# Patient Record
Sex: Male | Born: 1935 | Race: White | Hispanic: No | Marital: Married | Smoking: Never smoker
Health system: Southern US, Community
[De-identification: ages and names within clinical notes are randomized; demographics above are authoritative.]

## PROBLEM LIST (undated history)

## (undated) DIAGNOSIS — J45909 Unspecified asthma, uncomplicated: Secondary | ICD-10-CM

## (undated) HISTORY — PX: APPENDECTOMY: SHX54

---

## 2015-12-07 ENCOUNTER — Emergency Department
Admission: EM | Admit: 2015-12-07 | Discharge: 2015-12-07 | Disposition: A | Discharge status: Home / Self Care | Payer: Medicare Other | Attending: Student in an Organized Health Care Education/Training Program | Admitting: Student in an Organized Health Care Education/Training Program

## 2015-12-07 ENCOUNTER — Encounter: Payer: Self-pay | Admitting: Emergency Medicine

## 2015-12-07 DIAGNOSIS — H9222 Otorrhagia, left ear: Secondary | ICD-10-CM | POA: Insufficient documentation

## 2015-12-07 NOTE — Discharge Instructions (Signed)
Apply constant pressure to your ear if any continued bleeding. Follow-up with your doctor if any continued problems. Avoid ibuprofen or anti-inflammatories which may thin your blood and increased your risk. Return to the emergency room if any urgent concerns.

## 2015-12-07 NOTE — ED Triage Notes (Signed)
Pt 's earlobe started bleeding about 1 hour ago. States it happened 20 years ago and hasn't happened since until today.

## 2015-12-07 NOTE — ED Provider Notes (Signed)
[facility] Emergency Department Provider Note  ____________________________________________   First MD Initiated Contact with Patient 12/07/15 2207     (approximate)  I have reviewed the triage vital signs and the nursing notes.   HISTORY  Chief Complaint Ear Drainage   HPI Patrick Wu is a 82 y.o. male is here with complaint of left ear bleeding steadily for over an hour. Patient states that he held pressure to it for approximately 30 minutes without resolution of his bleeding. Patient denies any anti-inflammatories or blood thinners. He states he had something similar to this happen to 20 years ago when he was not on any medication. He denies any dizziness, any pain, or problems with his ear or hearing. Patient now states that he is ready to go because the bleeding has completely stopped.She denies any pain.   History reviewed. No pertinent past medical history.  There are no active problems to display for this patient.   Past Surgical History:  Procedure Laterality Date  . APPENDECTOMY      Prior to Admission medications   Not on File    Allergies Patient has no known allergies.  No family history on file.  Social History Social History  Substance Use Topics  . Smoking status: Never Smoker  . Smokeless tobacco: Not on file  . Alcohol use Yes     Comment: occassionally     Review of Systems Constitutional: No fever/chills ENT: Denies ear pain. Cardiovascular: Denies chest pain. Respiratory: Denies shortness of breath. Gastrointestinal:  No nausea, no vomiting.  Musculoskeletal: Negative for back pain. Skin: Positive for skin bleed left ear. Neurological: Negative for headaches, focal weakness or numbness.  10-point ROS otherwise negative.  ____________________________________________   PHYSICAL EXAM:  VITAL SIGNS: ED Triage Vitals  Enc Vitals Group     BP      Pulse      Resp      Temp      Temp src      SpO2       Weight      Height      Head Circumference      Peak Flow      Pain Score      Pain Loc      Pain Edu?      Excl. in [place]?     Constitutional: Alert and oriented. Well appearing and in no acute distress. Eyes: Conjunctivae are normal. PERRL. EOMI. Head: Atraumatic. Nose: No congestion/rhinnorhea.  Left earlobe there is no active bleeding at this time. There is a pinpoint suspicious area on the posterior reticular however there is no bleeding and there is no evidence of bleeding. Ear canal no blood was seen. Neck: No stridor.   Cardiovascular: Normal rate, regular rhythm. Grossly normal heart sounds.  Good peripheral circulation. Respiratory: Normal respiratory effort.  No retractions. Lungs CTAB. Musculoskeletal: Moves upper and lower extremities without any difficulty. Normal gait was noted. Neurologic:  Normal speech and language. No gross focal neurologic deficits are appreciated. No gait instability. Skin:  Skin is warm, dry and intact. No evidence of abrasion or active bleeding. No ecchymosis, erythema noted to the left ear. Psychiatric: Mood and affect are normal. Speech and behavior are normal.  ____________________________________________   LABS (all labs ordered are listed, but only abnormal results are displayed)  Labs Reviewed - No data to display  PROCEDURES  Procedure(s) performed: None  Procedures  Critical Care performed: No  ____________________________________________   INITIAL IMPRESSION /  ASSESSMENT AND PLAN / ED COURSE  Pertinent labs & imaging results that were available during my care of the patient were reviewed by me and considered in my medical decision making (see chart for details).    Clinical Course    Patient will follow up with his primary care doctor if any continued problems. He'll also hold pressure continuously if another bleed and return to the emergency room for any urgent  concerns.  ____________________________________________   FINAL CLINICAL IMPRESSION(S) / ED DIAGNOSES  Final diagnoses:  Bleeding from left ear      NEW MEDICATIONS STARTED DURING THIS VISIT:  There are no discharge medications for this patient.    Note:  This document was prepared using Dragon voice recognition software and may include unintentional dictation errors.    Johnn Hai, PA-C 12/07/15 2244    Merlyn Lot, MD 12/07/15 2350

## 2018-08-20 ENCOUNTER — Other Ambulatory Visit: Payer: Self-pay | Admitting: Internal Medicine

## 2018-08-20 ENCOUNTER — Ambulatory Visit
Admission: RE | Admit: 2018-08-20 | Discharge: 2018-08-20 | Disposition: A | Discharge status: Home / Self Care | Payer: Medicare Other | Source: Ambulatory Visit | Attending: Internal Medicine | Admitting: Internal Medicine

## 2018-08-20 ENCOUNTER — Other Ambulatory Visit: Payer: Self-pay

## 2018-08-20 DIAGNOSIS — R0602 Shortness of breath: Secondary | ICD-10-CM

## 2018-08-20 DIAGNOSIS — R0902 Hypoxemia: Secondary | ICD-10-CM | POA: Insufficient documentation

## 2018-08-20 HISTORY — DX: Unspecified asthma, uncomplicated: J45.909

## 2018-08-20 IMAGING — CT CT ANGIOGRAPHY CHEST
2 of 6 series · 18 of 36 positions shown · IV contrast (APPLIED)
Comparison: None.

CLINICAL DATA: Wheezing with nonproductive cough. Shortness of
breath. History of asthma. Clinical concern for pulmonary embolism.

EXAM:
CT ANGIOGRAPHY CHEST WITH CONTRAST
TECHNIQUE: Multidetector CT imaging of the chest was performed using the
standard protocol during bolus administration of intravenous
contrast. Multiplanar CT image reconstructions and MIPs were
obtained to evaluate the vascular anatomy.
CONTRAST:  75mL OMNIPAQUE IOHEXOL 350 MG/ML SOLN

[Series 5: thins · axial · 0.82mm/px · z∈[-400,-108]mm · 17 of 326 slices shown]
[im 17/326  lung]
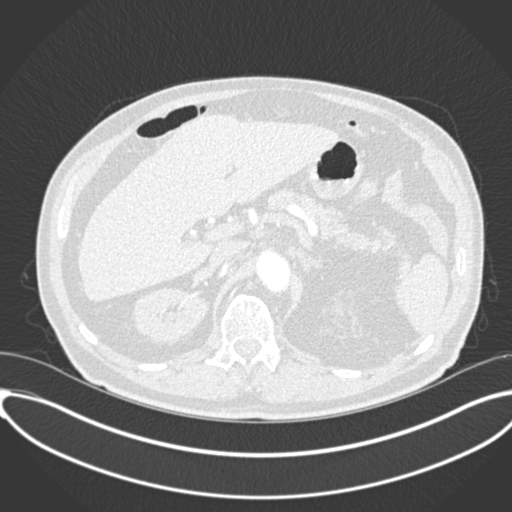
[im 33/326  mediastinal]
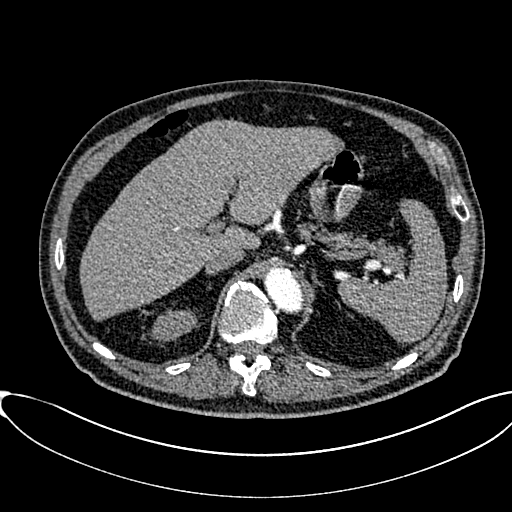
[im 49/326  lung]
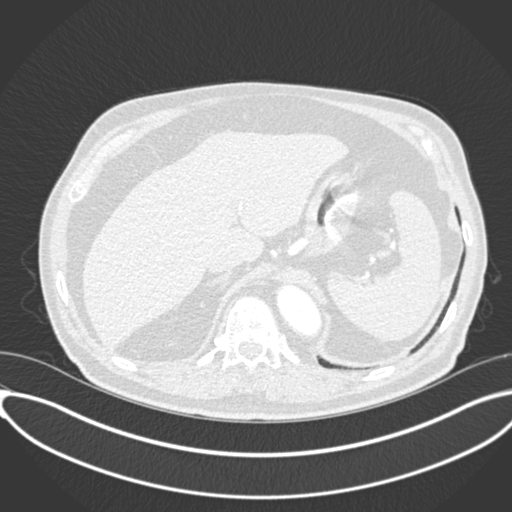
[im 66/326  mediastinal]
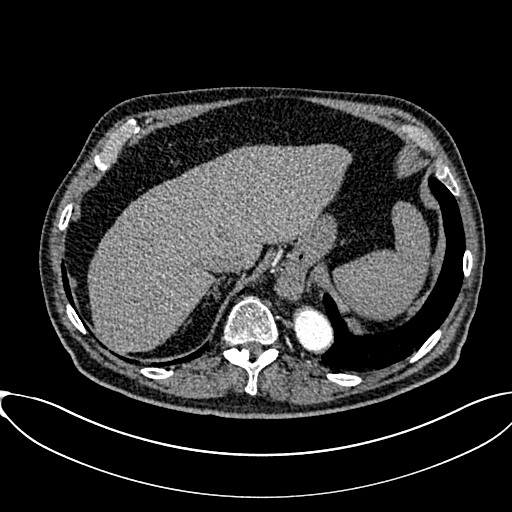
[im 98/326  lung]
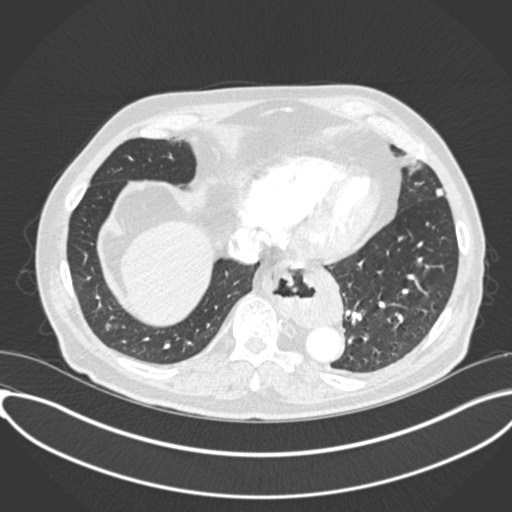
[im 114/326  mediastinal]
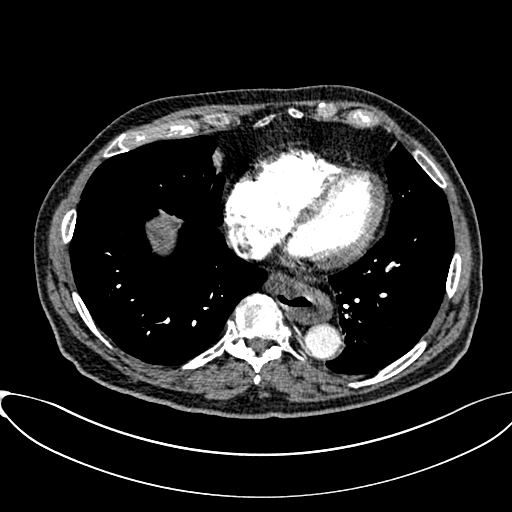
[im 131/326  lung]
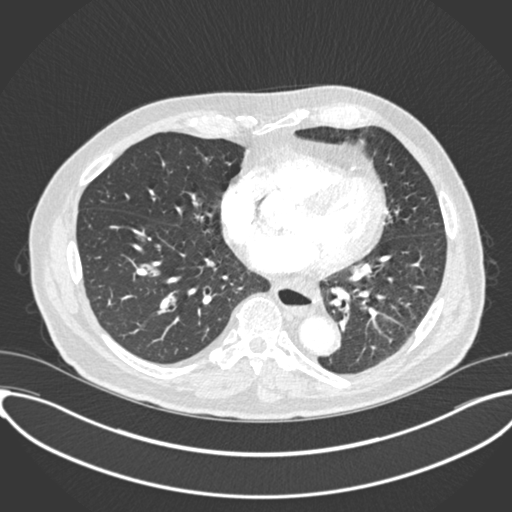
[im 147/326  mediastinal]
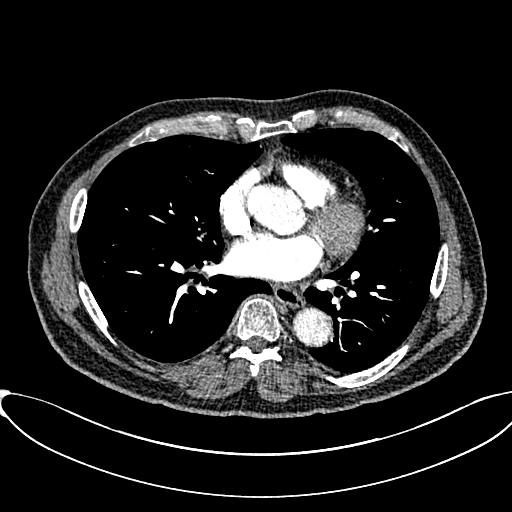
[im 163/326  lung]
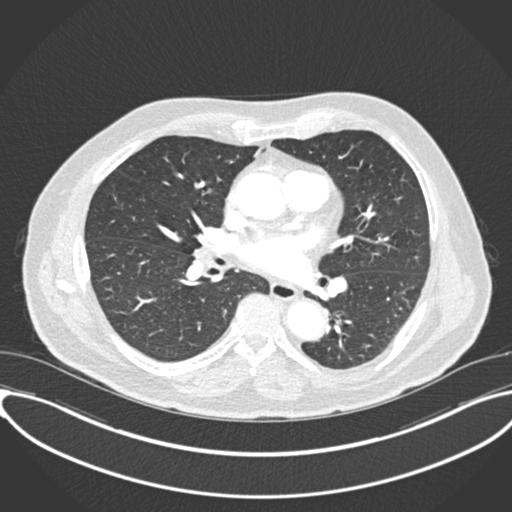
[im 179/326  mediastinal]
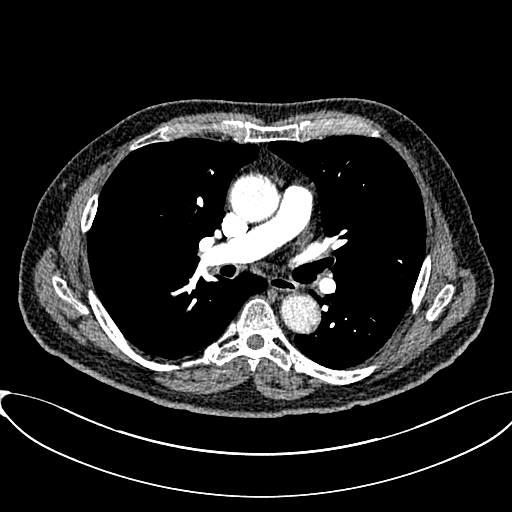
[im 196/326  lung]
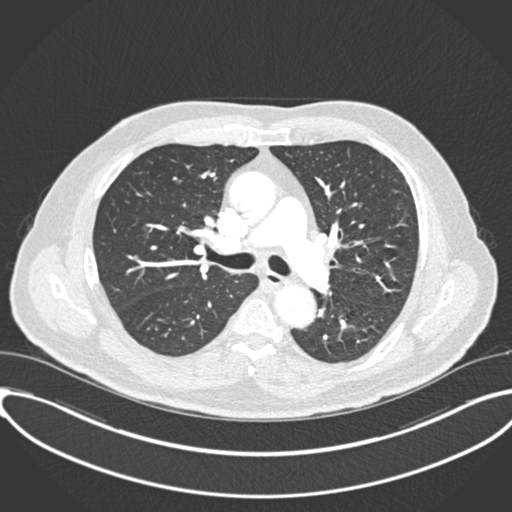
[im 212/326  mediastinal]
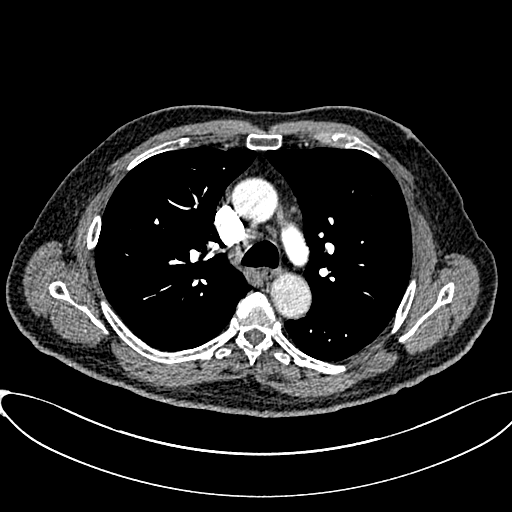
[im 228/326  lung]
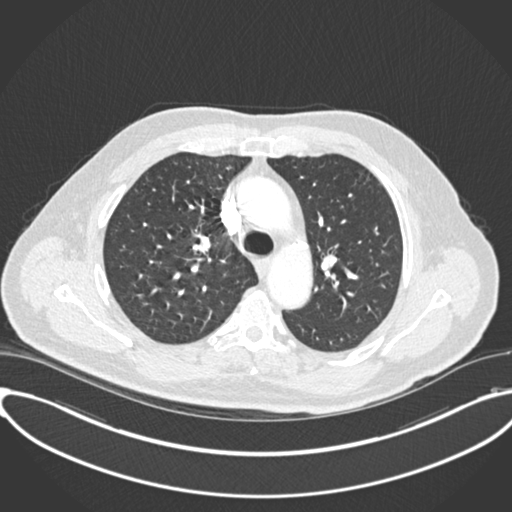
[im 261/326  mediastinal]
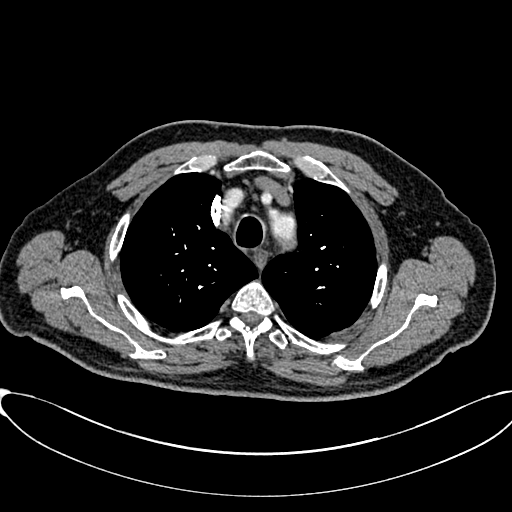
[im 277/326  lung]
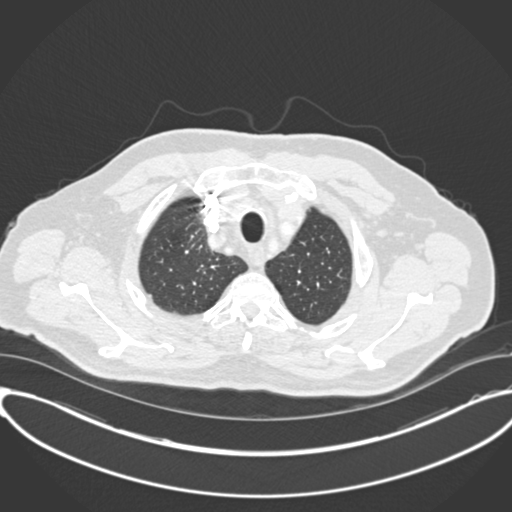
[im 293/326  mediastinal]
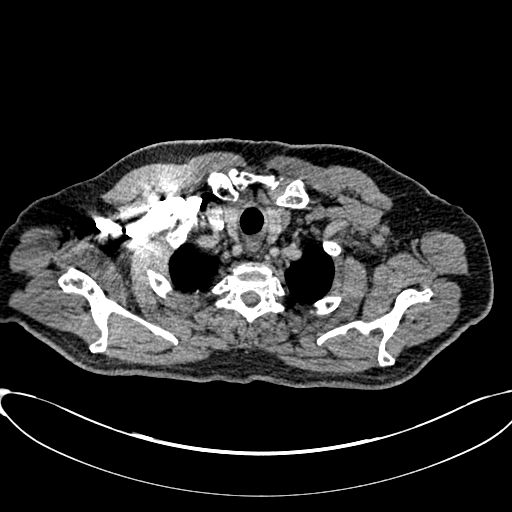
[im 309/326  lung]
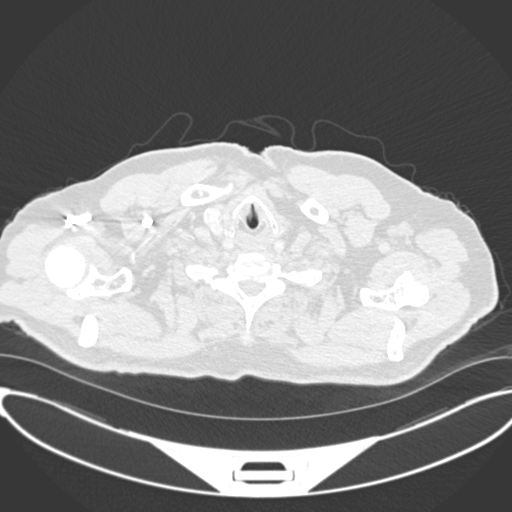

[Series 7: coronal mpr · coronal · 0.64mm/px · 1 of 93 slices shown]
[im 47/93  mediastinal]
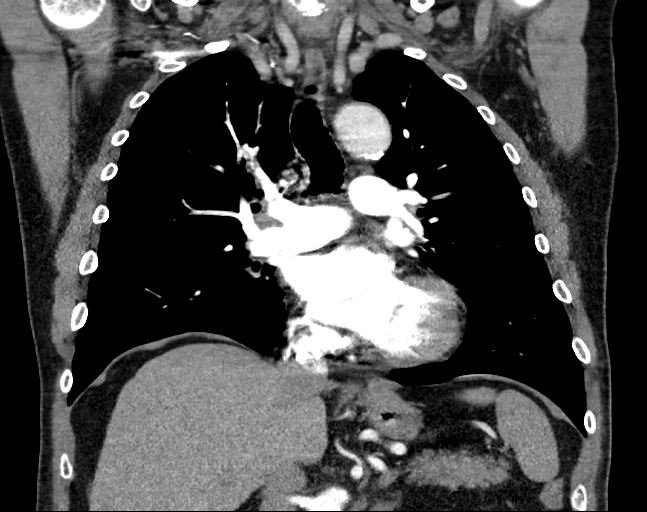

[18 of 36 positions shown; findings below may reference images not displayed]

FINDINGS: Cardiovascular: The pulmonary arteries are well opacified with
contrast to the level of the subsegmental branches. There is no
evidence of acute pulmonary embolism. Minimal aortic
atherosclerosis. The heart size is normal. There is no pericardial
effusion.

Mediastinum/Nodes: There are no enlarged mediastinal, hilar or
axillary lymph nodes.There is a moderate size hiatal hernia. The
trachea and thyroid gland appear normal.

Lungs/Pleura: There is no pleural effusion or pneumothorax. There is
mild centrilobular emphysema and central airway thickening. No
airspace disease or suspicious pulmonary nodularity.

Upper abdomen: No significant findings are seen within the
visualized upper abdomen. There is a small exophytic left renal
lesion which measures near water density, consistent with a cyst.

Musculoskeletal/Chest wall: There is no chest wall mass or
suspicious osseous finding. There is a nonacute appearing superior
endplate compression deformity at T12.

Review of the MIP images confirms the above findings.
IMPRESSION: 1. No evidence of acute pulmonary embolism or other acute chest
process.
2. Mild emphysema and mild central airway thickening.
3. Moderate-size hiatal hernia.
4. Nonacute appearing superior endplate compression deformity at
T12.

## 2018-08-20 MED ORDER — IOHEXOL 350 MG/ML SOLN
75.0000 mL | Freq: Once | INTRAVENOUS | Status: AC | PRN
  Administered 2018-08-20: 10:00:00 75 mL via INTRAVENOUS

## 2019-03-26 DIAGNOSIS — C4491 Basal cell carcinoma of skin, unspecified: Secondary | ICD-10-CM

## 2019-03-26 HISTORY — DX: Basal cell carcinoma of skin, unspecified: C44.91

## 2020-01-23 ENCOUNTER — Ambulatory Visit: Payer: Medicare Other | Attending: Sports Medicine

## 2020-01-23 ENCOUNTER — Other Ambulatory Visit: Payer: Self-pay

## 2020-01-23 DIAGNOSIS — M25511 Pain in right shoulder: Secondary | ICD-10-CM | POA: Diagnosis present

## 2020-01-23 DIAGNOSIS — G8929 Other chronic pain: Secondary | ICD-10-CM | POA: Insufficient documentation

## 2020-01-23 NOTE — Patient Instructions (Addendum)
Access Code: [email or web address] URL: [email or web address] Date: 01/23/2020 Prepared by: Roxana Hires  Exercises Seated Shoulder Abduction AAROM with Pulley Behind - 2 x daily - 7 x weekly - 2 sets - 10 reps - 3s hold Sleeper Stretch - 2 x daily - 7 x weekly - 3 reps - 45s hold Shoulder External Rotation and Scapular Retraction with Resistance - 1 x daily - 7 x weekly - 2 sets - 10 reps - 3s hold

## 2020-01-23 NOTE — Therapy (Unsigned)
[facility] [address] [place], [place], [address] Phone: [phone]   Fax:  [phone]  Physical Therapy Evaluation  Patient Details  Name: Patrick Wu MRN: [id number] Date of Birth: [birth year] Referring Provider (PT): Dr. Emily Filbert   Encounter Date: 01/23/2020   PT End of Session - 01/24/20 0953    Visit Number 1    Number of Visits 9    Date for PT Re-Evaluation 03/19/20    Authorization Type eval: 01/23/20    PT Start Time 1105    PT Stop Time 1150    PT Time Calculation (min) 45 min    Activity Tolerance Patient tolerated treatment well    Behavior During Therapy [facility] for tasks assessed/performed           Past Medical History:  Diagnosis Date  . Asthma     Past Surgical History:  Procedure Laterality Date  . APPENDECTOMY      There were no vitals filed for this visit.    Subjective Assessment - 01/23/20 1102    Subjective R shoulder pain    Pertinent History Pt reports that approximately 9 months ago he started having R shoulder pain. Pain is in the anterior aspect of shoulder and occasionally lateral near distal insertion of deltoid. Onset was insidious. He has a remote history of R shoulder bursitis (87 years old). He got injections and it resolved without any recurrence. No issues with neck injury, pain, or trauma. Shoulder symptoms have been unchanged recently. The pain is most notable when reaching behind his back to fasten his belt, dry his back, or access his back pocket. Pain will occasionally wake him up at night. He denies any imaging of his R shoulder. Denies any additional recent changes in health or medications.    Diagnostic tests None    Patient Stated Goals Decrease R shoulder pain and improve function when reaching behind back    Currently in Pain? No/denies    Pain Score 0-No pain   Worst: 5/10, Best: 0/10   Pain Location Shoulder    Pain Orientation Right    Pain Descriptors /  Indicators Other (Comment)   "Grabbing, catching"   Pain Type Chronic pain    Pain Radiating Towards None    Pain Onset More than a month ago    Pain Frequency Intermittent    Aggravating Factors  reaching behind back to fasten belt, dry back, or access back pocket    Pain Relieving Factors avoiding aggravating positions    Multiple Pain Sites No                [facility] PT Assessment - 01/24/20 0952      Assessment   Medical Diagnosis R shoulder pain    Referring Provider (PT) Dr. Emily Filbert    Onset Date/Surgical Date 04/11/19   Approximate   Hand Dominance Right    Next MD Visit Not reported    Prior Therapy None for shoulder      Precautions   Precautions None      Restrictions   Weight Bearing Restrictions No      Balance Screen   Has the patient fallen in the past 6 months No    Has the patient had a decrease in activity level because of a fear of falling?  No    Is the patient reluctant to leave their home because of a fear of falling?  No      Home  [place] residence    Living Arrangements Spouse/significant other    Available Help at Discharge Family    Type of [place]      Prior Function   Level of Independence Independent      Cognition   Overall Cognitive Status Within Functional Limits for tasks assessed                SUBJECTIVE Onset: Pt reports that approximately 9 months ago he started having R shoulder pain. Pain is in the anterior aspect of shoulder and occasionally lateral near distal insertion of deltoid. Onset was insidious. He has a remote history of R shoulder bursitis (87 years old). He got injections and it resolved without any recurrence. No issues with neck injury, pain, or trauma. Shoulder symptoms have been unchanged recently. The pain is most notable when reaching behind his back to fasten his belt, dry his back, or access his back pocket. Pain will occasionally wake him up at night. He denies any  imaging of his R shoulder. Denies any additional recent changes in health or medications.   Shoulder trauma: No  Pain quality: "grabbing, catching pain," Denies clicking or popping; Pain: Present: 0/10, Best: 0/10, Worst: 5/10; Aggravating factors: reaching behind the back to access back pocket, thread belt, or dry back after shower Easing factors: staying out of aggravating positions 24 hour pain behavior: No change Radiating pain: No Numbness/Tingling: No Prior history of shoulder injury or pain: No, except remote history of bursitis at age 61 Prior history of therapy for shoulder: No Dominant hand: Right   OBJECTIVE  MUSCULOSKELETAL: Tremor: Normal Bulk: Normal Tone: Normal  Cervical Screen AROM: WFL and painless with overpressure in all planes Spurlings A (ipsilateral lateral flexion/axial compression): R: Negative L: Negative Spurlings B (ipsilateral lateral flexion/contralateral rotation/axial compression): R: Negative L: Negative Hoffman Sign (cervical cord compression): Not examined ULTT Median: R: Not examined L: Not examined ULTT Ulnar: R: Not examined L: Not examined ULTT Radial: R: Not examined L: Not examined  Elbow Screen Elbow AROM: Within Normal Limits  Palpation Patient has moderate pain to palpation over long head of bicep tendon anterior bilateral shoulder.  Slightly more tender on the right side.  No pain to palpation along the lateral or posterior right shoulder including in the suprascapular or infrascapular fossa.  No pain to palpation at greater or lesser tubercles or along subscapularis and teres minor.  Strength R/L 5/5 Shoulder flexion (anterior deltoid/pec major/coracobrachialis, axillary n. (C5-6) and musculocutaneous n. (C5-7)) 5/5 Shoulder abduction (deltoid/supraspinatus, axillary/suprascapular n, C5) 4-*/4+ Shoulder external rotation, tested in sitting (infraspinatus/teres minor) 5/5 Shoulder internal rotation (subcapularis/lats/pec  major) 5/5 Elbow flexion (biceps brachii, brachialis, brachioradialis, musculoskeletal n, C5-6) 5/5 Elbow extension (triceps, radial n, C7) 5/5 Wrist Extension 5/5 Wrist Flexion 5/5 Finger adduction (interossei, ulnar n, T1)  AROM R/L 170*/165 Shoulder flexion 140*/165 Shoulder abduction 80/85 Shoulder external rotation 64/65 Shoulder internal rotation 60/60 Shoulder extension HBH: T4 BUE; HBB: L: T7, R: T12* painful and limited *Indicates pain, overpressure performed unless otherwise indicated AROM=PROM however pt does report pain at end range passive R shoulder internal rotation  Accessory Motions/Glides Glenohumeral: Posterior: R: normal L: normal Inferior: R: normal L: normal Anterior: R: normal L: normal  Acromioclavicular:  Deferred  Sternoclavicular: Deferred  Scapulothoracic: Distraction: R: not examined L: not examined Medial: R: not examined L: not examined Lateral: R: not examined L: not examined Inferior: R: not examined L: not examined Superior: R: not examined L: not  examined  Muscle Length Testing Deferred  NEUROLOGICAL:  Mental Status Patient is oriented to person, place and time.  Recent memory is intact.  Remote memory is intact.  Attention span and concentration are intact.  Expressive speech is intact.  Patient's fund of knowledge is within normal limits for educational level.  Cranial Nerves Deferred  Sensation Grossly intact to light touch bilateral UE as determined by testing dermatomes C2-T2 Proprioception and hot/cold testing deferred on this date  Reflexes Deferred  Coordination/Cerebellar Deferred  SPECIAL TESTS  Rotator Cuff  Drop Arm Test: Negative Painful Arc (Pain from 60 to 120 degrees scaption): Negative Infraspinatus Muscle Test: Positive  Subacromial Impingement Hawkins-Kennedy: Negative Neer (Block scapula, PROM flexion): Negative Painful Arc (Pain from 60 to 120 degrees scaption): Negative Empty Can:  Positive External Rotation Resistance: Positive Horizontal Adduction: Negative Scapular Assist: Not examined  Labral Tear Biceps Load II (120 elevation, full ER, 90 elbow flexion, full supination, resisted elbow flexion): Not examined Crank (160 scaption, axial load with IR/ER): Not examined Active Compression Test: Not examined  Bicep Tendon Pathology Speed (shoulder flexion to 90, external rotation, full elbow extension, and forearm supination with resistance: Positive Yergason's (resisted shoulder ER and supination/biceps tendon pathology): Negative  Shoulder Instability Sulcus Sign: Negative Anterior Apprehension: Nnegative  Outcome Measures FOTO: 75, predicted decline by 2 points to 73             Objective measurements completed on examination: See above findings.               PT Education - 01/24/20 0953    Education Details Plan of care and HEP    Person(s) Educated Patient    Methods Explanation    Comprehension Verbalized understanding            PT Short Term Goals - 01/24/20 1245      PT SHORT TERM GOAL #1   Title Pt will be independent with HEP in order to improve shoulder strength and decrease pain in order to improve pain-free function at home.    Time 4    Period Weeks    Status New    Target Date 02/20/20             PT Long Term Goals - 01/24/20 1246      PT LONG TERM GOAL #1   Title Pt will decrease worst shoulder pain as reported on NPRS by at least 3 points in order to demonstrate clinically significant reduction in pain.    Baseline 01/23/20: worst: 5/10    Time 8    Period Weeks    Status New    Target Date 03/19/20      PT LONG TERM GOAL #2   Title Pt will increase strength of  R shoulder external by at least 1/2 MMT grade in order to demonstrate improvement in strength and function    Baseline 01/23/20: 4-/5 in sitting    Time 8    Period Weeks    Status New    Target Date 03/19/20      PT LONG TERM GOAL #3    Title Pt will be able to reach behind back to access back pocket, thread belt, and dry back after shower without increase in shoulder pain    Time 8    Period Weeks    Status New    Target Date 03/19/20                  Plan -  01/24/20 0954    Clinical Impression Statement Pt is a pleasant 87 year-old male referred for right shoulder pain.  Pain occurs when reaching behind back with right arm to fasten belt, access back pants pocket, or dry back after shower. PT examination reveals deficits in right shoulder abduction active range of motion compared to left side.  Patient reports mild pain at end range right shoulder flexion and abduction active range of motion.  Mild pain and weakness noted with external rotation strength testing of right shoulder as well as passive right shoulder internal rotation.  Limited functional right shoulder internal rotation range of motion to around T12 compared to T7 for left shoulder. Patient has moderate pain to palpation over long head of bicep tendon anterior bilateral shoulders.  Slightly more tender on the right side.  Positive Speed test. Pt will benefit from PT services to address deficits in right shoulder strength, mobility, and pain in order to return to full function at home with less shoulder pain.    Personal Factors and Comorbidities Age;Time since onset of injury/illness/exacerbation    Examination-Activity Limitations Bathing;Dressing    Stability/Clinical Decision Making Stable/Uncomplicated    Clinical Decision Making Low    Rehab Potential Excellent    PT Frequency 1x / week    PT Duration 8 weeks    PT Treatment/Interventions ADLs/Self Care Home Management;Aquatic Therapy;Biofeedback;Canalith Repostioning;Cryotherapy;Electrical Stimulation;Iontophoresis 4mg /ml Dexamethasone;Moist Heat;Traction;Ultrasound;Gait training;Stair training;Functional mobility training;Therapeutic activities;Therapeutic exercise;Balance training;Neuromuscular  re-education;Cognitive remediation;Patient/family education;Manual techniques;Passive range of motion;Dry needling;Vestibular;Joint Manipulations;Spinal Manipulations    PT Next Visit Plan Pt to complete quickDASH, Progress R shoulder mobilization techniques, stretches, and external rotation strengthening    PT Home Exercise Plan Access Code: [email or web address]    Consulted and Agree with Plan of Care Patient           Patient will benefit from skilled therapeutic intervention in order to improve the following deficits and impairments:  Pain  Visit Diagnosis: Chronic right shoulder pain - Plan: PT plan of care cert/re-cert     Problem List There are no problems to display for this patient.  Jameca Chumley PT, DPT, GCS  Khaleef Ruby 01/24/2020, 1:04 PM  [facility] [address] [place], [place], [place] Phone: [phone]   Fax:  [phone]  Name: Patrick Wu MRN: Anda Latina Date of Birth: 1933/12/31

## 2020-01-28 ENCOUNTER — Ambulatory Visit: Payer: Medicare Other

## 2020-02-04 ENCOUNTER — Other Ambulatory Visit: Payer: Self-pay

## 2020-02-04 ENCOUNTER — Ambulatory Visit: Payer: Medicare Other

## 2020-02-04 DIAGNOSIS — M25511 Pain in right shoulder: Secondary | ICD-10-CM | POA: Diagnosis not present

## 2020-02-04 DIAGNOSIS — G8929 Other chronic pain: Secondary | ICD-10-CM

## 2020-02-04 NOTE — Patient Instructions (Signed)
Access Code: [email or web address] URL: [email or web address] Date: 02/04/2020 Prepared by: Roxana Hires  Exercises Seated Shoulder Abduction AAROM with Pulley Behind - 2 x daily - 7 x weekly - 2 sets - 10 reps - 3s hold Standing Shoulder Internal Rotation AAROM with Pulley - 1 x daily - 7 x weekly - 3 sets - 10 reps Shoulder External Rotation and Scapular Retraction with Resistance - 1 x daily - 7 x weekly - 2 sets - 10 reps - 3s hold Single Arm Shoulder Extension with Anchored Resistance - 1 x daily - 7 x weekly - 2 sets - 10 reps - 3s hold

## 2020-02-04 NOTE — Therapy (Signed)
[facility] [facility] [address] [place], [place], [address] Phone: [phone]   Fax:  [phone]  Physical Therapy Treatment  Patient Details  Name: Patrick Wu MRN: [id number] Date of Birth: [birth year] Referring Provider (PT): Dr. Emily Filbert   Encounter Date: 02/04/2020   PT End of Session - 02/04/20 1014    Visit Number 2    Number of Visits 9    Date for PT Re-Evaluation 03/19/20    Authorization Type eval: 01/23/20    PT Start Time 1015    PT Stop Time 1100    PT Time Calculation (min) 45 min    Activity Tolerance Patient tolerated treatment well    Behavior During Therapy [facility] for tasks assessed/performed           Past Medical History:  Diagnosis Date  . Asthma     Past Surgical History:  Procedure Laterality Date  . APPENDECTOMY      There were no vitals filed for this visit.   Subjective Assessment - 02/04/20 1013    Subjective Patient reports he is doing well today.  He noticed an increase in pain with right shoulder internal rotation "sleeper" stretch so stretch was discontinued at the instruction of physical therapist.  Otherwise he has been performing his HEP which is going well.  No specific questions or concerns upon arrival.    Pertinent History Pt reports that approximately 9 months ago he started having R shoulder pain. Pain is in the anterior aspect of shoulder and occasionally lateral near distal insertion of deltoid. Onset was insidious. He has a remote history of R shoulder bursitis (87 years old). He got injections and it resolved without any recurrence. No issues with neck injury, pain, or trauma. Shoulder symptoms have been unchanged recently. The pain is most notable when reaching behind his back to fasten his belt, dry his back, or access his back pocket. Pain will occasionally wake him up at night. He denies any imaging of his R shoulder. Denies any additional recent changes in health or medications.     Diagnostic tests None    Patient Stated Goals Decrease R shoulder pain and improve function when reaching behind back    Currently in Pain? No/denies                 TREATMENT   Manual Therapy  MHP applied to R shoulder x5 minutes at start of session during interval history; Gentle right shoulder passive range of motion into flexion, abduction, and external rotation; R shoulder GH A/P mobilizations at neutral grade II-III, 20s/bout x 3 bouts; R shoulder GH inferior mobilizations at 90 abduction grade II-III 20s/bout x 3 bouts; R shoulder distraction mobilizations gradually progressing through partial abduction PROM; "Floppy fish" R shoulder rhythmic relaxation x multiple bouts of 20-30s each; R shoulder anterior to posterior mobilizations at 90 flexion with therapist pressing through elbow, grade II-III, 20s/bout x 3 bouts; R shoulder medial to lateral mobilizations at 90 flexion, grade II-III, 20s/bout x 3 bouts; R shoulder A/P mobilizations at 90 abduction and available end range ER, grade I-II, 20s/bout x 2 bouts; STM to right bicep, anterior shoulder, lateral shoulder, and posterior shoulder with anterior focus on bicep tendon and posterior focus on insertion of external rotators; Left side-lying right scapular mobilizations for retraction and upward rotation x30 seconds each;   Ther-ex  Supine right shoulder serratus punch x10; Supine right shoulder isometric extension 5-second hold x10; Supine right shoulder isometric abduction 5-second  hold x10; Supine right shoulder isometric flexion with therapist providing resistance 5-second hold x10; Supine right shoulder isometric adduction squeezing ball against side 5-second hold x10; Left side-lying right shoulder external rotation with washcloth between elbow and body to prevent substitution and therapist providing manual resistance 2x10; Ice pack applied to left shoulder x 5 minutes during education regarding HEP  modifications; HEP modification provided to patient: pulley stretches for right shoulder internal rotation and right shoulder extension against resistance;    Pt educated throughout session about proper posture and technique with exercises. Improved exercise technique, movement at target joints, use of target muscles after min to mod verbal, visual, tactile cues.     Pt demonstrates excellent motivation during session today.  He reports only very mild intermittent pain during some right shoulder mobilizations.  Slight improvement in gross right shoulder internal rotation range of motion with hand behind the back assessment at end of session.  HEP modifications made to include right shoulder internal rotation stretches with pulley as well as right shoulder extension strengthening against resistance.  Patient advised to discontinue any internal rotation stretching that increases pain.  He has yet to achieve maximal benefit in physical therapy services and will continue to benefit from PT services to address deficits in shoulder mobility and strength in order to return to full function at home without pain.                 PT Education - 02/04/20 1334    Education Details HEP modification    Person(s) Educated Patient    Methods Explanation    Comprehension Verbalized understanding            PT Short Term Goals - 01/24/20 1245      PT SHORT TERM GOAL #1   Title Pt will be independent with HEP in order to improve shoulder strength and decrease pain in order to improve pain-free function at home.    Time 4    Period Weeks    Status New    Target Date 02/20/20             PT Long Term Goals - 01/24/20 1246      PT LONG TERM GOAL #1   Title Pt will decrease worst shoulder pain as reported on NPRS by at least 3 points in order to demonstrate clinically significant reduction in pain.    Baseline 01/23/20: worst: 5/10    Time 8    Period Weeks    Status New    Target Date  03/19/20      PT LONG TERM GOAL #2   Title Pt will increase strength of  R shoulder external by at least 1/2 MMT grade in order to demonstrate improvement in strength and function    Baseline 01/23/20: 4-/5 in sitting    Time 8    Period Weeks    Status New    Target Date 03/19/20      PT LONG TERM GOAL #3   Title Pt will be able to reach behind back to access back pocket, thread belt, and dry back after shower without increase in shoulder pain    Time 8    Period Weeks    Status New    Target Date 03/19/20                 Plan - 02/04/20 1014    Clinical Impression Statement Pt demonstrates excellent motivation during session today.  He reports only very mild  intermittent pain during some right shoulder mobilizations.  Slight improvement in gross right shoulder internal rotation range of motion with hand behind the back assessment at end of session.  HEP modifications made to include right shoulder internal rotation stretches with pulley as well as right shoulder extension strengthening against resistance.  Patient advised to discontinue any internal rotation stretching that increases pain.  He has yet to achieve maximal benefit in physical therapy services and will continue to benefit from PT services to address deficits in shoulder mobility and strength in order to return to full function at home without pain.    Personal Factors and Comorbidities Age;Time since onset of injury/illness/exacerbation    Examination-Activity Limitations Bathing;Dressing    Stability/Clinical Decision Making Stable/Uncomplicated    Rehab Potential Excellent    PT Frequency 1x / week    PT Duration 8 weeks    PT Treatment/Interventions ADLs/Self Care Home Management;Aquatic Therapy;Biofeedback;Canalith Repostioning;Cryotherapy;Electrical Stimulation;Iontophoresis 4mg /ml Dexamethasone;Moist Heat;Traction;Ultrasound;Gait training;Stair training;Functional mobility training;Therapeutic  activities;Therapeutic exercise;Balance training;Neuromuscular re-education;Cognitive remediation;Patient/family education;Manual techniques;Passive range of motion;Dry needling;Vestibular;Joint Manipulations;Spinal Manipulations    PT Next Visit Plan Progress R shoulder mobilization techniques, stretches, and external rotation strengthening. Consider thoracic mobilizations    PT Home Exercise Plan Access Code: [email or web address]    Consulted and Agree with Plan of Care Patient           Patient will benefit from skilled therapeutic intervention in order to improve the following deficits and impairments:  Pain  Visit Diagnosis: Chronic right shoulder pain     Problem List There are no problems to display for this patient.  Lyndel Safe Tyrhonda Georgiades PT, DPT, GCS  Antoinette Haskett 02/04/2020, 3:32 PM  [facility] [address] [place], [place], [address] Phone: [phone]   Fax:  [phone]  Name: Patrick Wu MRN: [id number] Date of Birth: 10/14/1933

## 2020-02-12 ENCOUNTER — Other Ambulatory Visit: Payer: Self-pay

## 2020-02-12 ENCOUNTER — Ambulatory Visit: Payer: Medicare Other | Attending: Internal Medicine

## 2020-02-12 DIAGNOSIS — M25511 Pain in right shoulder: Secondary | ICD-10-CM | POA: Diagnosis present

## 2020-02-12 DIAGNOSIS — G8929 Other chronic pain: Secondary | ICD-10-CM

## 2020-02-12 NOTE — Therapy (Signed)
[place] [facility] [address] [place], [place], [address] Phone: [phone]   Fax:  [phone]  Physical Therapy Treatment  Patient Details  Name: Patrick Wu MRN: [id number] Date of Birth: 05/22/1933 Referring Provider (PT): Dr. Emily Filbert   Encounter Date: 02/12/2020   PT End of Session - 02/12/20 1019    Visit Number 3    Number of Visits 9    Date for PT Re-Evaluation 03/19/20    Authorization Type eval: 01/23/20    PT Start Time 1017    PT Stop Time 1100    PT Time Calculation (min) 43 min    Activity Tolerance Patient tolerated treatment well    Behavior During Therapy [facility] for tasks assessed/performed           Past Medical History:  Diagnosis Date  . Asthma     Past Surgical History:  Procedure Laterality Date  . APPENDECTOMY      There were no vitals filed for this visit.   Subjective Assessment - 02/12/20 1018    Subjective Patient reports he is doing well today. No resting pain upon arrival. No change in pain or function noted yet in R shoulder. Pt states that he is able to do internal rotation stretching behind the back without pain but has pain when getting into the position.    Pertinent History Pt reports that approximately 9 months ago he started having R shoulder pain. Pain is in the anterior aspect of shoulder and occasionally lateral near distal insertion of deltoid. Onset was insidious. He has a remote history of R shoulder bursitis (87 years old). He got injections and it resolved without any recurrence. No issues with neck injury, pain, or trauma. Shoulder symptoms have been unchanged recently. The pain is most notable when reaching behind his back to fasten his belt, dry his back, or access his back pocket. Pain will occasionally wake him up at night. He denies any imaging of his R shoulder. Denies any additional recent changes in health or medications.    Diagnostic tests None    Patient Stated Goals  Decrease R shoulder pain and improve function when reaching behind back    Currently in Pain? No/denies                TREATMENT   Manual Therapy  MHP applied to R shoulder x5 minutes at start of session during interval history; Gentle right shoulder passive range of motion into flexion, abduction, and external rotation; R shoulder GH A/P mobilizations at neutral grade II-III, 20s/bout x 3 bouts; R shoulder GH inferior mobilizations at 90 abduction grade II-III 20s/bout x 3 bouts; R shoulder distraction mobilizations gradually progressing through partial abduction PROM; "Floppy fish" R shoulder rhythmic relaxation x multiple bouts of 20-30s each; R shoulder GH A/P mobilizations at 90 abduction and available end range ER, grade I-II, 20s/bout x 2 bouts; Left side-lying right scapular mobilizations for retraction and upward rotation x30 seconds each;   Ther-ex  L sidelying right shoulder manually resisted extension 2 x 10; L sidelying right shoulder external rotation with washcloth between elbow and body to prevent substitution and therapist providing manual resistance 2x10; Prone R shoulder extension with 2# dumbbell (DB) 2 x 10; Prone R shoulder high rows with 2# DB 2 x 10; Prone R shoulder scaption (Y's) 2 x 10, cues for full scapular retraction;   Iontophoresis Iontophoresis applied to R subacromial space with pt in sitting position with ice pack on  shoulder. Utilized 3.5 mL of dexamethasone (4mg /mL) with 42mA/min dosage at 4.0 mA x 10 minutes. Pt denies any discomfort during or afterward. Skin inspected afterward without any signs of superficial irritation. Pt encouraged to leave patch on shoulder for another 2 hours after therapy session and then remove. Pt provided education about iontophoresis during and after treatment.    Pt educated throughout session about proper posture and technique with exercises. Improved exercise technique, movement at target joints, use of  target muscles after min to mod verbal, visual, tactile cues.     Pt demonstrates excellent motivation during session today. He continues to report R shoulder pain with extension and no notable change in range of motion at end of session. No HEP modifications made today. Continued with manual techniques and added prone periscapular strengthening to session today. Added iontophoresis to R shoulder with 3.64mL of dexamethasone today (4mg /mL). Will repeat for 1-2 more sessions. He has yet to achieve maximal benefit in physical therapy services and will continue to benefit from PT services to address deficits in shoulder mobility and strength in order to return to full function at home without pain.                            PT Short Term Goals - 01/24/20 1245      PT SHORT TERM GOAL #1   Title Pt will be independent with HEP in order to improve shoulder strength and decrease pain in order to improve pain-free function at home.    Time 4    Period Weeks    Status New    Target Date 02/20/20             PT Long Term Goals - 01/24/20 1246      PT LONG TERM GOAL #1   Title Pt will decrease worst shoulder pain as reported on NPRS by at least 3 points in order to demonstrate clinically significant reduction in pain.    Baseline 01/23/20: worst: 5/10    Time 8    Period Weeks    Status New    Target Date 03/19/20      PT LONG TERM GOAL #2   Title Pt will increase strength of  R shoulder external by at least 1/2 MMT grade in order to demonstrate improvement in strength and function    Baseline 01/23/20: 4-/5 in sitting    Time 8    Period Weeks    Status New    Target Date 03/19/20      PT LONG TERM GOAL #3   Title Pt will be able to reach behind back to access back pocket, thread belt, and dry back after shower without increase in shoulder pain    Time 8    Period Weeks    Status New    Target Date 03/19/20                 Plan - 02/12/20 1020     Clinical Impression Statement Pt demonstrates excellent motivation during session today. He continues to report R shoulder pain with extension and no notable change in range of motion at end of session. No HEP modifications made today. Continued with manual techniques and added prone periscapular strengthening to session today. Added iontophoresis to R shoulder with 3.81mL of dexamethasone today (4mg /mL). Will repeat for 1-2 more sessions. He has yet to achieve maximal benefit in physical therapy services and will continue to benefit  from PT services to address deficits in shoulder mobility and strength in order to return to full function at home without pain.    Personal Factors and Comorbidities Age;Time since onset of injury/illness/exacerbation    Examination-Activity Limitations Bathing;Dressing    Stability/Clinical Decision Making Stable/Uncomplicated    Rehab Potential Excellent    PT Frequency 1x / week    PT Duration 8 weeks    PT Treatment/Interventions ADLs/Self Care Home Management;Aquatic Therapy;Biofeedback;Canalith Repostioning;Cryotherapy;Electrical Stimulation;Iontophoresis 4mg /ml Dexamethasone;Moist Heat;Traction;Ultrasound;Gait training;Stair training;Functional mobility training;Therapeutic activities;Therapeutic exercise;Balance training;Neuromuscular re-education;Cognitive remediation;Patient/family education;Manual techniques;Passive range of motion;Dry needling;Vestibular;Joint Manipulations;Spinal Manipulations    PT Next Visit Plan Progress R shoulder mobilization techniques, stretches, and external rotation strengthening. Consider thoracic mobilizations    PT Home Exercise Plan Access Code: [email or web address]    Consulted and Agree with Plan of Care Patient           Patient will benefit from skilled therapeutic intervention in order to improve the following deficits and impairments:  Pain  Visit Diagnosis: Chronic right shoulder pain     Problem List There are no problems  to display for this patient.  Indea Dearman PT, DPT, GCS  Luberta Grabinski 02/12/2020, 4:30 PM  [place] [facility] [address]. [place], [place], [place] Phone: [phone]   Fax:  [phone]  Name: Issak Goley MRN: Anda Latina Date of Birth: 1933-07-26

## 2020-02-19 ENCOUNTER — Ambulatory Visit: Payer: Medicare Other

## 2020-02-19 ENCOUNTER — Other Ambulatory Visit: Payer: Self-pay

## 2020-02-19 DIAGNOSIS — M25511 Pain in right shoulder: Secondary | ICD-10-CM

## 2020-02-19 DIAGNOSIS — G8929 Other chronic pain: Secondary | ICD-10-CM

## 2020-02-19 NOTE — Therapy (Signed)
[facility] [address] [place], [place], [address] Phone: [phone]   Fax:  [phone]  Physical Therapy Treatment  Patient Details  Name: Patrick Wu MRN: [id number] Date of Birth: [birth year] Referring Provider (PT): Dr. Emily Filbert   Encounter Date: 02/19/2020   PT End of Session - 02/19/20 1025    Visit Number 4    Number of Visits 9    Date for PT Re-Evaluation 03/19/20    Authorization Type eval: 01/23/20    PT Start Time 1020    PT Stop Time 1105    PT Time Calculation (min) 45 min    Activity Tolerance Patient tolerated treatment well    Behavior During Therapy [facility] for tasks assessed/performed           Past Medical History:  Diagnosis Date  . Asthma     Past Surgical History:  Procedure Laterality Date  . APPENDECTOMY      There were no vitals filed for this visit.   Subjective Assessment - 02/19/20 1021    Subjective Patient reports he is doing well today. No resting pain upon arrival. No change in pain or function noted yet in R shoulder. No adverse skin reaction to ionto patch after last session. He does report some mild irritation in R shoulder with internal rotation stretches.    Pertinent History Pt reports that approximately 9 months ago he started having R shoulder pain. Pain is in the anterior aspect of shoulder and occasionally lateral near distal insertion of deltoid. Onset was insidious. He has a remote history of R shoulder bursitis (87 years old). He got injections and it resolved without any recurrence. No issues with neck injury, pain, or trauma. Shoulder symptoms have been unchanged recently. The pain is most notable when reaching behind his back to fasten his belt, dry his back, or access his back pocket. Pain will occasionally wake him up at night. He denies any imaging of his R shoulder. Denies any additional recent changes in health or medications.    Diagnostic tests None    Patient  Stated Goals Decrease R shoulder pain and improve function when reaching behind back    Currently in Pain? No/denies             TREATMENT   Manual Therapy  MHP applied to R shoulder x5 minutes at start of session during interval history; Gentle right shoulder passive range of motion into flexion, abduction, and external rotation; R shoulder GH A/P mobilizations at neutral grade II-III, 20s/bout x 3 bouts; R shoulder GH inferior mobilizations at 90 abduction grade II-III 20s/bout x 3 bouts; R shoulder distraction mobilizations gradually progressing through partial abduction PROM; "Floppy fish" R shoulder rhythmic relaxation x multiple bouts of 20-30s each; R shoulder GH A/P mobilizations at 90 abduction and available end range ER, grade I-II, 20s/bout x 2 bouts; Left side-lying right scapular mobilizations for retraction and downward rotation x 30 seconds each;   Ther-ex  L sidelying right shoulder external rotation with washcloth between elbow and body to prevent substitution and therapist providing manual resistance 3x10; Prone R shoulder extension with 2# dumbbell (DB) 2 x 10; Prone R shoulder high rows with 2# DB 2 x 10; Prone R shoulder scaption (Y's) 2 x 10, verbal and tactile cues for full scapular retraction;   Iontophoresis Iontophoresis applied to R subacromial space (a little more posterior today) with pt in sitting position. Utilized 3.5 mL of dexamethasone (4mg /mL) with 69mA/min dosage  at 4.0 mA x 10 minutes. Pt denies any discomfort during or afterward. Skin inspected afterward without any signs of superficial irritation. Pt encouraged to leave patch on shoulder for another 2 hours after therapy session and then remove.   Trigger Point Dry Needling (TDN), unbilled Education performed with patient regarding potential benefit of TDN. Reviewed precautions and risks with patient. Reviewed special precautions/risks over lung fields which include pneumothorax. Reviewed  signs and symptoms of pneumothorax and advised pt to go to ER immediately if these symptoms develop advise them of dry needling treatment. Pt provided verbal consent to treatment. TDN performed to R infraspinatus with 1, 0.25 x 40 single needle placement as well as R posterior deltoid with 2, 0.25 x 40 single needle placements. Pistoning technique utilized. Improved pain-free HBH motion after needling.    Pt educated throughout session about proper posture and technique with exercises. Improved exercise technique, movement at target joints, use of target muscles after min to mod verbal, visual, tactile cues.     Pt demonstrates excellent motivation during session today. Introduced trigger point dry needling to R infraspinatus and R posterior deltoid today. He demonstrates resolution of his pain with AROM R HBB after needling. HEP modifications made today to include low rows and shoulder wall slides. Continued with manual techniques and prone periscapular strengthening today. Continued with iontophoresis to R shoulder as well with 3.61mL of dexamethasone (4mg /mL). Will repeat for 1 more session. He has yet to achieve maximal benefit in physical therapy services and will continue to benefit from PT services to address deficits in shoulder mobility and strength in order to return to full function at home without pain.                          PT Education - 02/19/20 1120    Education Details HEP modification    Person(s) Educated Patient    Methods Explanation    Comprehension Verbalized understanding            PT Short Term Goals - 01/24/20 1245      PT SHORT TERM GOAL #1   Title Pt will be independent with HEP in order to improve shoulder strength and decrease pain in order to improve pain-free function at home.    Time 4    Period Weeks    Status New    Target Date 02/20/20             PT Long Term Goals - 01/24/20 1246      PT LONG TERM GOAL #1   Title Pt  will decrease worst shoulder pain as reported on NPRS by at least 3 points in order to demonstrate clinically significant reduction in pain.    Baseline 01/23/20: worst: 5/10    Time 8    Period Weeks    Status New    Target Date 03/19/20      PT LONG TERM GOAL #2   Title Pt will increase strength of  R shoulder external by at least 1/2 MMT grade in order to demonstrate improvement in strength and function    Baseline 01/23/20: 4-/5 in sitting    Time 8    Period Weeks    Status New    Target Date 03/19/20      PT LONG TERM GOAL #3   Title Pt will be able to reach behind back to access back pocket, thread belt, and dry back after shower without increase in  shoulder pain    Time 8    Period Weeks    Status New    Target Date 03/19/20                 Plan - 02/19/20 1026    Clinical Impression Statement Pt demonstrates excellent motivation during session today. Introduced trigger point dry needling to R infraspinatus and R posterior deltoid today. He demonstrates resolution of his pain with AROM R HBB after needling. HEP modifications made today to include low rows and shoulder wall slides. Continued with manual techniques and prone periscapular strengthening today. Continued with iontophoresis to R shoulder as well with 3.44mL of dexamethasone (4mg /mL). Will repeat for 1 more session. He has yet to achieve maximal benefit in physical therapy services and will continue to benefit from PT services to address deficits in shoulder mobility and strength in order to return to full function at home without pain.    Personal Factors and Comorbidities Age;Time since onset of injury/illness/exacerbation    Examination-Activity Limitations Bathing;Dressing    Stability/Clinical Decision Making Stable/Uncomplicated    Rehab Potential Excellent    PT Frequency 1x / week    PT Duration 8 weeks    PT Treatment/Interventions ADLs/Self Care Home Management;Aquatic Therapy;Biofeedback;Canalith  Repostioning;Cryotherapy;Electrical Stimulation;Iontophoresis 4mg /ml Dexamethasone;Moist Heat;Traction;Ultrasound;Gait training;Stair training;Functional mobility training;Therapeutic activities;Therapeutic exercise;Balance training;Neuromuscular re-education;Cognitive remediation;Patient/family education;Manual techniques;Passive range of motion;Dry needling;Vestibular;Joint Manipulations;Spinal Manipulations    PT Next Visit Plan Progress R shoulder mobilization techniques, stretches, and external rotation strengthening. Consider thoracic mobilizations    PT Home Exercise Plan Access Code: [email or web address]    Consulted and Agree with Plan of Care Patient           Patient will benefit from skilled therapeutic intervention in order to improve the following deficits and impairments:  Pain  Visit Diagnosis: Chronic right shoulder pain     Problem List There are no problems to display for this patient.  Patrick Wu PT, DPT, GCS  Patrick Wu 02/19/2020, 11:44 AM  [facility] [address] [place], [place], [address] Phone: [phone]   Fax:  [phone]  Name: Patrick Wu MRN: [id number] Date of Birth: 1933/05/05

## 2020-02-19 NOTE — Patient Instructions (Signed)
Access Code: [email or web address] URL: [email or web address] Date: 02/19/2020 Prepared by: Roxana Hires  Exercises Seated Shoulder Abduction AAROM with Pulley Behind - 2 x daily - 7 x weekly - 2 sets - 10 reps - 3s hold Standing Shoulder Internal Rotation AAROM with Pulley - 1 x daily - 7 x weekly - 3 sets - 10 reps Shoulder External Rotation and Scapular Retraction with Resistance - 1 x daily - 7 x weekly - 2 sets - 10 reps - 3s hold Single Arm Shoulder Extension with Anchored Resistance - 1 x daily - 7 x weekly - 2 sets - 10 reps - 3s hold Standing Bilateral Low Shoulder Row with Anchored Resistance - 1 x daily - 7 x weekly - 2 sets - 10 reps - 3s hold Low Trap Setting at [place] - 1 x daily - 7 x weekly - 2 sets - 10 reps - 3s hold

## 2020-02-26 ENCOUNTER — Other Ambulatory Visit: Payer: Self-pay

## 2020-02-26 ENCOUNTER — Ambulatory Visit: Payer: Medicare Other

## 2020-02-26 DIAGNOSIS — M25511 Pain in right shoulder: Secondary | ICD-10-CM | POA: Diagnosis not present

## 2020-02-26 DIAGNOSIS — G8929 Other chronic pain: Secondary | ICD-10-CM

## 2020-02-26 NOTE — Therapy (Signed)
[facility] [address] [place], [place], [address] Phone: [phone]   Fax:  [phone]  Physical Therapy Treatment  Patient Details  Name: Patrick Wu MRN: [id number] Date of Birth: August 19, 1933 Referring Provider (PT): Dr. Emily Filbert   Encounter Date: 02/26/2020   PT End of Session - 02/26/20 1024    Visit Number 5    Number of Visits 9    Date for PT Re-Evaluation 03/19/20    Authorization Type eval: 01/23/20    PT Start Time 1019    PT Stop Time 1104    PT Time Calculation (min) 45 min    Activity Tolerance Patient tolerated treatment well    Behavior During Therapy [facility] for tasks assessed/performed           Past Medical History:  Diagnosis Date  . Asthma     Past Surgical History:  Procedure Laterality Date  . APPENDECTOMY      There were no vitals filed for this visit.   Subjective Assessment - 02/26/20 1022    Subjective Patient reports he is doing well today. No resting pain upon arrival. He reports improvement in his shoulder symptoms after the last therapy session for the rest of that day and believes that the dry needling was helpful. He has not noticed any improvement in his shoulder range of motion.    Pertinent History Pt reports that approximately 9 months ago he started having R shoulder pain. Pain is in the anterior aspect of shoulder and occasionally lateral near distal insertion of deltoid. Onset was insidious. He has a remote history of R shoulder bursitis (87 years old). He got injections and it resolved without any recurrence. No issues with neck injury, pain, or trauma. Shoulder symptoms have been unchanged recently. The pain is most notable when reaching behind his back to fasten his belt, dry his back, or access his back pocket. Pain will occasionally wake him up at night. He denies any imaging of his R shoulder. Denies any additional recent changes in health or medications.    Diagnostic  tests None    Patient Stated Goals Decrease R shoulder pain and improve function when reaching behind back    Currently in Pain? No/denies              TREATMENT   Manual Therapy  MHP applied to R shoulder x5 minutes at start of session during interval history; Gentle right shoulder passive range of motion into flexion, abduction, and external rotation, good range noted today without any increase in end range pain; R shoulder GH A/P mobilizations at neutral grade II-III, 20s/bout x 3 bouts; R shoulder GH inferior mobilizations at 90 abduction grade II-III 20s/bout x 3 bouts; R shoulder distraction mobilizations gradually progressing through partial abduction PROM; R shoulder GH A/P mobilizations at 90 abduction and available end range ER, grade I-II, 20s/bout x 2 bouts; Prone STM to R infraspinatus, teres minor, and posterior deltoid with TrP release    Ther-ex  Prone R shoulder isometric external resistance 10s hold x 5; Prone R shoulder mid trap high rows with manual resistance from therapist 3 x 10; Prone R shoulder scaption (Y's) 3 x 10, verbal and tactile cues for full scapular retraction;   Iontophoresis Iontophoresis applied to R subacromial space (a little more posterior today) with pt in sitting position. Utilized 3.5 mL of dexamethasone (4mg /mL) with 93mA/min dosage at 4.0 mA x 10 minutes. Pt denies any discomfort during or afterward. Pt  encouraged to leave patch on shoulder for another 2 hours after therapy session and then remove.   Trigger Point Dry Needling (TDN), unbilled Education performed with patient regarding potential benefit of TDN. Reviewed precautions and risks with patient. Reviewed special precautions/risks over lung fields which include pneumothorax. Reviewed signs and symptoms of pneumothorax and advised pt to go to ER immediately if these symptoms develop advise them of dry needling treatment. Pt provided verbal consent to treatment. TDN performed to  R infraspinatus with 2, 0.25 x 40 single needle placements (1 distal and one closer to mid muscle belly over the scapula) as well as R teres minor with 2, 0.25 x 60 single needle placements with pull-away technique and arm . Pistoning technique utilized. Aching during all placements and jump response with infraspinatus mid muscle belly placement.     Pt educated throughout session about proper posture and technique with exercises. Improved exercise technique, movement at target joints, use of target muscles after min to mod verbal, visual, tactile cues.     Pt demonstrates excellent motivation during session today. Continued trigger point dry needling today as pt had a positive response during last session. He reports persistent shoulder pain today even after dry needling. Continued with manual techniques and prone periscapular strengthening today. Continued with iontophoresis to R shoulder as well with 3.78mL of dexamethasone (4mg /mL). This is the last session of iontophoresis. Included additional STM to posterior R shoulder today with notable trigger points and pain.  He has yet to achieve maximal benefit in physical therapy services and will continue to benefit from PT services to address deficits in shoulder mobility and strength in order to return to full function at home without pain.                              PT Short Term Goals - 01/24/20 1245      PT SHORT TERM GOAL #1   Title Pt will be independent with HEP in order to improve shoulder strength and decrease pain in order to improve pain-free function at home.    Time 4    Period Weeks    Status New    Target Date 02/20/20             PT Long Term Goals - 01/24/20 1246      PT LONG TERM GOAL #1   Title Pt will decrease worst shoulder pain as reported on NPRS by at least 3 points in order to demonstrate clinically significant reduction in pain.    Baseline 01/23/20: worst: 5/10    Time 8    Period Weeks     Status New    Target Date 03/19/20      PT LONG TERM GOAL #2   Title Pt will increase strength of  R shoulder external by at least 1/2 MMT grade in order to demonstrate improvement in strength and function    Baseline 01/23/20: 4-/5 in sitting    Time 8    Period Weeks    Status New    Target Date 03/19/20      PT LONG TERM GOAL #3   Title Pt will be able to reach behind back to access back pocket, thread belt, and dry back after shower without increase in shoulder pain    Time 8    Period Weeks    Status New    Target Date 03/19/20  Plan - 02/26/20 1024    Clinical Impression Statement Pt demonstrates excellent motivation during session today. Continued trigger point dry needling today as pt had a positive response during last session. He reports persistent shoulder pain today even after dry needling. Continued with manual techniques and prone periscapular strengthening today. Continued with iontophoresis to R shoulder as well with 3.64mL of dexamethasone (4mg /mL). This is the last session of iontophoresis. Included additional STM to posterior R shoulder today with notable trigger points and pain.  He has yet to achieve maximal benefit in physical therapy services and will continue to benefit from PT services to address deficits in shoulder mobility and strength in order to return to full function at home without pain.    Personal Factors and Comorbidities Age;Time since onset of injury/illness/exacerbation    Examination-Activity Limitations Bathing;Dressing    Stability/Clinical Decision Making Stable/Uncomplicated    Rehab Potential Excellent    PT Frequency 1x / week    PT Duration 8 weeks    PT Treatment/Interventions ADLs/Self Care Home Management;Aquatic Therapy;Biofeedback;Canalith Repostioning;Cryotherapy;Electrical Stimulation;Iontophoresis 4mg /ml Dexamethasone;Moist Heat;Traction;Ultrasound;Gait training;Stair training;Functional mobility  training;Therapeutic activities;Therapeutic exercise;Balance training;Neuromuscular re-education;Cognitive remediation;Patient/family education;Manual techniques;Passive range of motion;Dry needling;Vestibular;Joint Manipulations;Spinal Manipulations    PT Next Visit Plan Progress R shoulder mobilization techniques, stretches, and external rotation strengthening. Consider thoracic mobilizations    PT Home Exercise Plan Access Code: [email or web address]    Consulted and Agree with Plan of Care Patient           Patient will benefit from skilled therapeutic intervention in order to improve the following deficits and impairments:  Pain  Visit Diagnosis: Chronic right shoulder pain     Problem List There are no problems to display for this patient.  Patrick Wu Lamar Naef PT, DPT, GCS  Taysen Bushart 02/26/2020, 2:38 PM  [facility] [address] [place], [place], [address] Phone: [phone]   Fax:  [phone]  Name: Amad Mau MRN: [id number] Date of Birth: [birth year]

## 2020-03-04 ENCOUNTER — Other Ambulatory Visit: Payer: Self-pay

## 2020-03-04 ENCOUNTER — Ambulatory Visit: Payer: Medicare Other

## 2020-03-04 DIAGNOSIS — M25511 Pain in right shoulder: Secondary | ICD-10-CM | POA: Diagnosis not present

## 2020-03-04 DIAGNOSIS — G8929 Other chronic pain: Secondary | ICD-10-CM

## 2020-03-04 NOTE — Therapy (Signed)
[facility] [address] [place], [place], [address] Phone: [phone]   Fax:  [phone]  Physical Therapy Treatment  Patient Details  Name: Patrick Wu MRN: [id number] Date of Birth: Mar 21, 1933 Referring Provider (PT): Dr. Emily Filbert   Encounter Date: 03/04/2020   PT End of Session - 03/04/20 1605    Visit Number 6    Number of Visits 9    Date for PT Re-Evaluation 03/19/20    Authorization Type eval: 01/23/20    PT Start Time 1019    PT Stop Time 1105    PT Time Calculation (min) 46 min    Activity Tolerance Patient tolerated treatment well    Behavior During Therapy [facility] for tasks assessed/performed           Past Medical History:  Diagnosis Date  . Asthma     Past Surgical History:  Procedure Laterality Date  . APPENDECTOMY      There were no vitals filed for this visit.   Subjective Assessment - 03/04/20 1016    Subjective Patient reports he is doing well today. No resting pain upon arrival. He reports that his shoulder is gradually improving with respect to pain and range of motion. He is now able to reach behind his back with his R hand in certain positions without pain.Marland Kitchen He is also noticing improved ability to reach up his back. No specific questions or concerns upon arrival.    Pertinent History Pt reports that approximately 9 months ago he started having R shoulder pain. Pain is in the anterior aspect of shoulder and occasionally lateral near distal insertion of deltoid. Onset was insidious. He has a remote history of R shoulder bursitis (87 years old). He got injections and it resolved without any recurrence. No issues with neck injury, pain, or trauma. Shoulder symptoms have been unchanged recently. The pain is most notable when reaching behind his back to fasten his belt, dry his back, or access his back pocket. Pain will occasionally wake him up at night. He denies any imaging of his R shoulder. Denies  any additional recent changes in health or medications.    Limitations Lifting    Diagnostic tests None    Patient Stated Goals Decrease R shoulder pain and improve function when reaching behind back    Currently in Pain? No/denies              TREATMENT   Manual Therapy  MHP applied to R shoulder x5 minutes at start of session during interval history; Prone R glenohumeral P/A mobilizations at 90 abduction, grade II-III, 20s/bout x 3 bouts; Supine R glenohural A/P mobilizations at neutral, grade II-III, 20s/bout x 3 bouts; Supine R glenohumeral inferior mobilizations at 90 abduction grade II-III 20s/bout x 3 bouts; Supine R glenohumeral A/P mobilizations at 90 abduction and available end range ER, grade II-III, 20s/bout x 3 bouts; Prone STM to R infraspinatus, teres minor, and posterior deltoid with instrument assist as well as TrP release. Occasional TrP release over infrapsinatus/teres minor with passive R glenohumeral IR/ER;    Ther-ex  Prone R shoulder external rotation with manual resistance by therapist 3s hold 2 x 10; Prone R shoulder mid trap high rows with manual resistance from therapist 2 x 10; Prone R shoulder scaption (Y's) 2 x 10, verbal and tactile cues for full scapular retraction; L sidelying R shoulder ER with manual resistance from therapist 2 x 10;   Trigger Point Dry Needling (TDN), unbilled Previously  performed education with patient regarding potential benefit and risks of TDN. Pt provided verbal consent to treatment. Using clean technique TDN performed to R infraspinatus with 2, 0.25 x 40 single needle placements (1 distal and one closer to mid muscle belly over the scapula) as well as R teres minor with 1, 0.25 x 60 single needle placements with pull-away technique. Pistoning technique utilized. Aching during all placements and local twitch response with distal infraspinatus and teres minor placements.     Pt educated throughout session about proper  posture and technique with exercises. Improved exercise technique, movement at target joints, use of target muscles after min to mod verbal, visual, tactile cues.     Pt demonstrates excellent motivation during session today. Continued trigger point dry needling today and manual techniques. Utilized occasional instrument assist during soft tissue mobilization of R shoulder. Performed prone and L sidelying periscapular and rotator cuff strengthening today. Pt reports some mild increase in shoulder pain at end of session. Overall he is making progress with respect to R shoulder range of motion and pain. Pt provided green and blue therabands today to progress his HEP. He has yet to achieve maximal benefit in physical therapy services and will continue to benefit from PT services to address deficits in shoulder mobility and strength in order to return to full function at home without pain.                                 PT Short Term Goals - 01/24/20 1245      PT SHORT TERM GOAL #1   Title Pt will be independent with HEP in order to improve shoulder strength and decrease pain in order to improve pain-free function at home.    Time 4    Period Weeks    Status New    Target Date 02/20/20             PT Long Term Goals - 01/24/20 1246      PT LONG TERM GOAL #1   Title Pt will decrease worst shoulder pain as reported on NPRS by at least 3 points in order to demonstrate clinically significant reduction in pain.    Baseline 01/23/20: worst: 5/10    Time 8    Period Weeks    Status New    Target Date 03/19/20      PT LONG TERM GOAL #2   Title Pt will increase strength of  R shoulder external by at least 1/2 MMT grade in order to demonstrate improvement in strength and function    Baseline 01/23/20: 4-/5 in sitting    Time 8    Period Weeks    Status New    Target Date 03/19/20      PT LONG TERM GOAL #3   Title Pt will be able to reach behind back to access back  pocket, thread belt, and dry back after shower without increase in shoulder pain    Time 8    Period Weeks    Status New    Target Date 03/19/20                 Plan - 03/04/20 1606    Clinical Impression Statement Pt demonstrates excellent motivation during session today. Continued trigger point dry needling today and manual techniques. Utilized occasional instrument assist during soft tissue mobilization of R shoulder. Performed prone and L sidelying periscapular and rotator cuff  strengthening today. Pt reports some mild increase in shoulder pain at end of session. Overall he is making progress with respect to R shoulder range of motion and pain. Pt provided green and blue therabands today to progress his HEP. He has yet to achieve maximal benefit in physical therapy services and will continue to benefit from PT services to address deficits in shoulder mobility and strength in order to return to full function at home without pain.    Personal Factors and Comorbidities Age;Time since onset of injury/illness/exacerbation    Examination-Activity Limitations Bathing;Dressing    Stability/Clinical Decision Making Stable/Uncomplicated    Rehab Potential Excellent    PT Frequency 1x / week    PT Duration 8 weeks    PT Treatment/Interventions ADLs/Self Care Home Management;Aquatic Therapy;Biofeedback;Canalith Repostioning;Cryotherapy;Electrical Stimulation;Iontophoresis 4mg /ml Dexamethasone;Moist Heat;Traction;Ultrasound;Gait training;Stair training;Functional mobility training;Therapeutic activities;Therapeutic exercise;Balance training;Neuromuscular re-education;Cognitive remediation;Patient/family education;Manual techniques;Passive range of motion;Dry needling;Vestibular;Joint Manipulations;Spinal Manipulations    PT Next Visit Plan Progress R shoulder mobilization techniques, stretches, and external rotation strengthening. Consider thoracic mobilizations    PT Home Exercise Plan Access Code:  [email or web address]    Consulted and Agree with Plan of Care Patient           Patient will benefit from skilled therapeutic intervention in order to improve the following deficits and impairments:  Pain  Visit Diagnosis: Chronic right shoulder pain     Problem List There are no problems to display for this patient.  Phillips Grout PT, DPT, GCS  Reinhard Schack 03/04/2020, 4:34 PM  [facility] [address] [place], [place], [address] Phone: [phone]   Fax:  [phone]  Name: Patrick Wu MRN: [id number] Date of Birth: [birth year]

## 2020-03-11 ENCOUNTER — Other Ambulatory Visit: Payer: Self-pay

## 2020-03-11 ENCOUNTER — Ambulatory Visit: Payer: Medicare Other | Attending: Internal Medicine

## 2020-03-11 DIAGNOSIS — M25511 Pain in right shoulder: Secondary | ICD-10-CM | POA: Insufficient documentation

## 2020-03-11 DIAGNOSIS — G8929 Other chronic pain: Secondary | ICD-10-CM | POA: Diagnosis present

## 2020-03-11 NOTE — Therapy (Signed)
[place] [address] [place], [place], [address] Phone: [phone]   Fax:  [phone]  Physical Therapy Treatment/Goals Update   Patient Details  Name: Patrick Wu MRN: [id number] Date of Birth: [birth year] Referring Provider (PT): Dr. Emily Filbert   Encounter Date: 03/11/2020   PT End of Session - 03/11/20 1012    Visit Number 7    Number of Visits 9    Date for PT Re-Evaluation 03/19/20    Authorization Type eval: 01/23/20    PT Start Time 1015    PT Stop Time 1100    PT Time Calculation (min) 45 min    Activity Tolerance Patient tolerated treatment well    Behavior During Therapy [facility] for tasks assessed/performed           Past Medical History:  Diagnosis Date  . Asthma     Past Surgical History:  Procedure Laterality Date  . APPENDECTOMY      There were no vitals filed for this visit.   Subjective Assessment - 03/11/20 1011    Subjective Patient reports he is doing well today. No resting pain upon arrival. His shoulder pain is improving and he reports that it is around 75% improved compared to when he started therapy.Marland Kitchen He is also noticing improved ability to reach up his back. No specific questions or concerns upon arrival.    Pertinent History Pt reports that approximately 9 months ago he started having R shoulder pain. Pain is in the anterior aspect of shoulder and occasionally lateral near distal insertion of deltoid. Onset was insidious. He has a remote history of R shoulder bursitis (87 years old). He got injections and it resolved without any recurrence. No issues with neck injury, pain, or trauma. Shoulder symptoms have been unchanged recently. The pain is most notable when reaching behind his back to fasten his belt, dry his back, or access his back pocket. Pain will occasionally wake him up at night. He denies any imaging of his R shoulder. Denies any additional recent changes in health or medications.     Limitations Lifting    Diagnostic tests None    Patient Stated Goals Decrease R shoulder pain and improve function when reaching behind back    Currently in Pain? No/denies              TREATMENT   Manual Therapy UBE x 4 minutes (2 minutes forward/2 minutes backwards) for warm-up during  Supine R glenohural A/P mobilizations at neutral, grade II-III, 20s/bout x 3 bouts; Supine R glenohumeral A/P mobilizations at 90 abduction and available end range ER, grade II-III, 20s/bout x 3 bouts; Prone R glenohumeral P/A mobilizations at 90 abduction, grade II-III, 20s/bout x 3 bouts; Prone trigger point release to R infraspinatus and teres minor with passive R glenohumeral IR/ER;    Ther-ex  Supine R shoulder flexion from neutral to 90 degrees with 5# dumbbell 2 x 10; Supine R shoulder serratus punch with manual resistance from therapist 2 x 10; Supine R shoulder rythmic perturbations at 90 flexion and resistance at wrist 30s x 2; L sidelying R shoulder ER with manual resistance from therapist 2 x 10; L sidelying R shoulder abduction with 5# dumbbell from side to 90 abduction 2 x 10; Prone R shoulder external rotation with manual resistance by therapist 3s hold 2 x 10; Prone R shoulder internal rotation with manual resistance by therapist 3s hold 2 x 10; Prone R shoulder mid trap high rows with  5# dumbbell 2 x 10; Prone R shoulder standard rows with 5# dumbbell 2 x 10; Prone R shoulder extension with manual resistance from therapist 2 x 10; Prone R shoulder scaption (Y's) 2 x 10, verbal and tactile cues for full scapular retraction, light manual resistance from therapist 2 x 10;     Pt educated throughout session about proper posture and technique with exercises. Improved exercise technique, movement at target joints, use of target muscles after min to mod verbal, visual, tactile cues.     Pt demonstrates excellent motivation during session today. Updated goals with patient during  session today. He reports decrease in worst pain from 5/10 at initial evaluation to 2-3/10 today. He states that his shoulder pain is approximately 75% improved since the initial evaluation. Significant increase in R shoulder external rotation strength as tested in sitting compared to initial evaluation. Performed supine, prone, and L sidelying periscapular and rotator cuff strengthening today. Overall he is making progress with respect to R shoulder range of motion and pain. He has yet to achieve maximal benefit in physical therapy services and will continue to benefit from PT services to address deficits in shoulder mobility and strength in order to return to full function at home without pain.                             PT Short Term Goals - 03/11/20 1020      PT SHORT TERM GOAL #1   Title Pt will be independent with HEP in order to improve shoulder strength and decrease pain in order to improve pain-free function at home.    Time 4    Period Weeks    Status Achieved             PT Long Term Goals - 03/11/20 1020      PT LONG TERM GOAL #1   Title Pt will decrease worst shoulder pain as reported on NPRS by at least 3 points in order to demonstrate clinically significant reduction in pain.    Baseline 01/23/20: worst: 5/10; 03/11/20: 2-3/10;    Time 8    Period Weeks    Status Partially Met    Target Date 03/19/20      PT LONG TERM GOAL #2   Title Pt will increase strength of  R shoulder external by at least 1/2 MMT grade in order to demonstrate improvement in strength and function    Baseline 01/23/20: 4-/5 in sitting; 03/11/20: 4+/5;    Time 8    Period Weeks    Status Achieved    Target Date --      PT LONG TERM GOAL #3   Title Pt will be able to reach behind back to access back pocket, thread belt, and dry back after shower without increase in shoulder pain    Baseline 03/11/20: 75% improvement but still painful at certain points in the motion    Time 8     Period Weeks    Status Partially Met    Target Date 03/19/20                 Plan - 03/11/20 1012    Clinical Impression Statement Pt demonstrates excellent motivation during session today. Updated goals with patient during session today. He reports decrease in worst pain from 5/10 at initial evaluation to 2-3/10 today. He states that his shoulder pain is approximately 75% improved since the initial evaluation. Significant  increase in R shoulder external rotation strength as tested in sitting compared to initial evaluation. Performed supine, prone, and L sidelying periscapular and rotator cuff strengthening today. Overall he is making progress with respect to R shoulder range of motion and pain. He has yet to achieve maximal benefit in physical therapy services and will continue to benefit from PT services to address deficits in shoulder mobility and strength in order to return to full function at home without pain.    Personal Factors and Comorbidities Age;Time since onset of injury/illness/exacerbation    Examination-Activity Limitations Bathing;Dressing    Stability/Clinical Decision Making Stable/Uncomplicated    Rehab Potential Excellent    PT Frequency 1x / week    PT Duration 8 weeks    PT Treatment/Interventions ADLs/Self Care Home Management;Aquatic Therapy;Biofeedback;Canalith Repostioning;Cryotherapy;Electrical Stimulation;Iontophoresis 26m/ml Dexamethasone;Moist Heat;Traction;Ultrasound;Gait training;Stair training;Functional mobility training;Therapeutic activities;Therapeutic exercise;Balance training;Neuromuscular re-education;Cognitive remediation;Patient/family education;Manual techniques;Passive range of motion;Dry needling;Vestibular;Joint Manipulations;Spinal Manipulations    PT Next Visit Plan Pt to complete FOTO, recert vs discharge, progress R shoulder mobilization techniques, stretches, and external rotation strengthening. Consider thoracic mobilizations    PT Home  Exercise Plan Access Code: [email or web address]    Consulted and Agree with Plan of Care Patient           Patient will benefit from skilled therapeutic intervention in order to improve the following deficits and impairments:  Pain  Visit Diagnosis: Chronic right shoulder pain     Problem List There are no problems to display for this patient.  JLyndel SafeHuprich PT, DPT, GCS  Yana Schorr 03/11/2020, 11:51 AM  [facility] [place] [address]Phone: [phone]  Fax:  [phone] Name: Patrick KinzlerMRN: [id number] of Birth: 428-Nov-1935

## 2020-03-18 ENCOUNTER — Ambulatory Visit: Payer: Medicare Other

## 2020-03-18 ENCOUNTER — Other Ambulatory Visit: Payer: Self-pay

## 2020-03-18 DIAGNOSIS — M25511 Pain in right shoulder: Secondary | ICD-10-CM | POA: Diagnosis not present

## 2020-03-18 DIAGNOSIS — G8929 Other chronic pain: Secondary | ICD-10-CM

## 2020-03-18 NOTE — Therapy (Signed)
[facility] [address] [place], [place], [address] Phone: [phone]   Fax:  [phone]  Physical Therapy Treatment/Recertification  Patient Details  Name: Patrick Wu MRN: [id number] Date of Birth: 10-29-1933 Referring Provider (PT): Dr. Emily Filbert   Encounter Date: 03/18/2020   PT End of Session - 03/18/20 1432    Visit Number 8    Number of Visits 17    Date for PT Re-Evaluation 05/13/20    Authorization Type eval: 01/23/20    PT Start Time 1428    PT Stop Time 1513    PT Time Calculation (min) 45 min    Activity Tolerance Patient tolerated treatment well    Behavior During Therapy [facility] for tasks assessed/performed           Past Medical History:  Diagnosis Date  . Asthma     Past Surgical History:  Procedure Laterality Date  . APPENDECTOMY      There were no vitals filed for this visit.   Subjective Assessment - 03/18/20 1430    Subjective Patient reports he is doing well today. No resting pain upon arrival. His shoulder pain is improving however he continues to have days where it is painful. No specific questions or concerns upon arrival.    Pertinent History Pt reports that approximately 9 months ago he started having R shoulder pain. Pain is in the anterior aspect of shoulder and occasionally lateral near distal insertion of deltoid. Onset was insidious. He has a remote history of R shoulder bursitis (87 years old). He got injections and it resolved without any recurrence. No issues with neck injury, pain, or trauma. Shoulder symptoms have been unchanged recently. The pain is most notable when reaching behind his back to fasten his belt, dry his back, or access his back pocket. Pain will occasionally wake him up at night. He denies any imaging of his R shoulder. Denies any additional recent changes in health or medications.    Limitations Lifting    Diagnostic tests None    Patient Stated Goals Decrease R  shoulder pain and improve function when reaching behind back    Currently in Pain? No/denies               TREATMENT   Manual Therapy UBE x 4 minutes (2 minutes forward/2 minutes backwards) for warm-up during history (2 minutes unbilled); Moist heat pack to R shoulder in sidelying x 4 minutes;  Supine R glenohural A/P mobilizations at neutral, grade II-III, 20s/bout x 3 bouts; Supine R glenohural A/P mobilizations at 90 flexion, grade II-III, 20s/bout x 3 bouts, mild reproduction of pain; Supine R glenohumeral A/P mobilizations at 90 abduction and available end range ER, grade II-III, 20s/bout x 3 bouts; Biceps load I and II negative, grind test negative; Prone IASTM with intermittent ischemic compression trigger point release to R infraspinatus, R supraspinatus, R rhomboids, R upper trap, and R teres minor/major;    Ther-ex  Supine R shoulder serratus punch with manual resistance from therapist 2 x 10; L sidelying R shoulder ER with manual resistance from therapist 2 x 10;   Trigger Point Dry Needling (TDN), unbilled Previously performed education with patient regarding potential benefit and risks of TDN. Pt provided verbal consent to treatment. Using clean technique TDN performed to R posterior deltoid with 1, 0.25 x 40 single needle placement, R supraspinatus with 1, 0.25 x 40 single needle placement (scapula contacted), R upper trap with 1, 0.30 x 60 single needle  placement, and R anterior deltoid with 2, 0.25 x 40 single needle placements. Most significant responses in R upper trap as well as R anterior deltoid. Reproduction of concordant pain in R anterior deltoid. Pistoning technique utilized. Aching during all placements and local twitch response with upper trap and anterior deltoid placements. Improved pain free hand behind back motion after TDN.    Pt educated throughout session about proper posture and technique with exercises. Improved exercise technique, movement at  target joints, use of target muscles after min to mod verbal, visual, tactile cues.     Pt demonstrates excellent motivation during session today. Updated goals with patient during last session. He reports decrease in worst pain from 5/10 at initial evaluation to 2-3/10 and his shoulder pain is approximately 50-75% improved since the initial evaluation. Significant increase in R shoulder external rotation strength as tested in sitting compared to initial evaluation. He continues to have intermittent pain when reaching behind his back. Overall he is making progress with respect to R shoulder range of motion and pain. He has yet to achieve maximal benefit in physical therapy services and will continue to benefit from PT services to address deficits in shoulder mobility and strength in order to return to full function at home without pain.                                   PT Short Term Goals - 03/19/20 0850      PT SHORT TERM GOAL #1   Title Pt will be independent with HEP in order to improve shoulder strength and decrease pain in order to improve pain-free function at home.    Time 4    Period Weeks    Status Achieved             PT Long Term Goals - 03/19/20 0850      PT LONG TERM GOAL #1   Title Pt will decrease worst shoulder pain as reported on NPRS by at least 3 points in order to demonstrate clinically significant reduction in pain.    Baseline 01/23/20: worst: 5/10; 03/11/20: 2-3/10;    Time 8    Period Weeks    Status Partially Met    Target Date 05/13/20      PT LONG TERM GOAL #2   Title Pt will increase strength of  R shoulder external by at least 1/2 MMT grade in order to demonstrate improvement in strength and function    Baseline 01/23/20: 4-/5 in sitting; 03/11/20: 4+/5;    Time 8    Period Weeks    Status Achieved      PT LONG TERM GOAL #3   Title Pt will be able to reach behind back to access back pocket, thread belt, and dry back after  shower without increase in shoulder pain    Baseline 03/11/20: 75% improvement but still painful at certain points in the motion    Time 8    Period Weeks    Status Partially Met    Target Date 05/13/20                 Plan - 03/18/20 1435    Clinical Impression Statement Pt demonstrates excellent motivation during session today. Updated goals with patient during last session. He reports decrease in worst pain from 5/10 at initial evaluation to 2-3/10 and his shoulder pain is approximately 50-75% improved since the initial evaluation.  Significant increase in R shoulder external rotation strength as tested in sitting compared to initial evaluation. He continues to have intermittent pain when reaching behind his back. Overall he is making progress with respect to R shoulder range of motion and pain. He has yet to achieve maximal benefit in physical therapy services and will continue to benefit from PT services to address deficits in shoulder mobility and strength in order to return to full function at home without pain.    Personal Factors and Comorbidities Age;Time since onset of injury/illness/exacerbation    Examination-Activity Limitations Bathing;Dressing    Stability/Clinical Decision Making Stable/Uncomplicated    Rehab Potential Excellent    PT Frequency 1x / week    PT Duration 8 weeks    PT Treatment/Interventions ADLs/Self Care Home Management;Aquatic Therapy;Biofeedback;Canalith Repostioning;Cryotherapy;Electrical Stimulation;Iontophoresis 35m/ml Dexamethasone;Moist Heat;Traction;Ultrasound;Gait training;Stair training;Functional mobility training;Therapeutic activities;Therapeutic exercise;Balance training;Neuromuscular re-education;Cognitive remediation;Patient/family education;Manual techniques;Passive range of motion;Dry needling;Vestibular;Joint Manipulations;Spinal Manipulations    PT Next Visit Plan Pt to complete FOTO, progress R shoulder mobilization techniques, stretches,  and external rotation strengthening. Consider thoracic mobilizations    PT Home Exercise Plan Access Code: [email or web address]    Consulted and Agree with Plan of Care Patient           Patient will benefit from skilled therapeutic intervention in order to improve the following deficits and impairments:  Pain  Visit Diagnosis: Chronic right shoulder pain     Problem List There are no problems to display for this patient.  JLyndel SafeHuprich PT, DPT, GCS  Jovi Alvizo 03/19/2020, 9:02 AM  [place] [facility] [place] [address]Phone: [phone]  Fax:  [phone] Name: WWadie LiewMRN: [id number] of Birth: 401-Nov-1935

## 2020-04-01 ENCOUNTER — Ambulatory Visit: Payer: Medicare Other

## 2020-04-01 ENCOUNTER — Other Ambulatory Visit: Payer: Self-pay

## 2020-04-01 DIAGNOSIS — M25511 Pain in right shoulder: Secondary | ICD-10-CM

## 2020-04-01 DIAGNOSIS — G8929 Other chronic pain: Secondary | ICD-10-CM

## 2020-04-01 NOTE — Therapy (Signed)
[facility] [address] [place], [place], [address] Phone: [phone]   Fax:  [phone]  Physical Therapy Treatment  Patient Details  Name: Patrick Wu MRN: [id number] Date of Birth: 1933-05-31 Referring Provider (PT): Dr. Emily Filbert   Encounter Date: 04/01/2020   PT End of Session - 04/01/20 1016    Visit Number 9    Number of Visits 17    Date for PT Re-Evaluation 05/13/20    Authorization Type eval: 01/23/20    PT Start Time 1018    PT Stop Time 1100    PT Time Calculation (min) 42 min    Activity Tolerance Patient tolerated treatment well    Behavior During Therapy [facility] for tasks assessed/performed           Past Medical History:  Diagnosis Date  . Asthma     Past Surgical History:  Procedure Laterality Date  . APPENDECTOMY      There were no vitals filed for this visit.   Subjective Assessment - 04/02/20 0929    Subjective Patient reports he is doing well today. No resting pain upon arrival. His shoulder pain is improving and he reports approximately 50-75% improvement since starting therapy.  He no longer has any resting shoulder pain however complains of intermittent pain when reaching behind his back.    Pertinent History Pt reports that approximately 9 months ago he started having R shoulder pain. Pain is in the anterior aspect of shoulder and occasionally lateral near distal insertion of deltoid. Onset was insidious. He has a remote history of R shoulder bursitis (87 years old). He got injections and it resolved without any recurrence. No issues with neck injury, pain, or trauma. Shoulder symptoms have been unchanged recently. The pain is most notable when reaching behind his back to fasten his belt, dry his back, or access his back pocket. Pain will occasionally wake him up at night. He denies any imaging of his R shoulder. Denies any additional recent changes in health or medications.    Limitations  Lifting    Diagnostic tests None    Patient Stated Goals Decrease R shoulder pain and improve function when reaching behind back    Currently in Pain? No/denies            TREATMENT   Manual Therapy UBE x 4 minutes (2 minutes forward/2 minutes backwards) for warm-up during history (2 minutes unbilled); Supine R glenohural A/P mobilizations at neutral, grade II-III, 20s/bout x 3 bouts; Supine R glenohural A/P mobilizations at 90 flexion, grade II-III, 20s/bout x 3 bouts, mild reproduction of pain; Supine R glenohumeral A/P mobilizations at 90 abduction and available end range ER, grade II-III, 20s/bout x 3 bouts; Palpation to entire right shoulder without any indication of trigger points or pain;   Ther-ex  Nautilus rows 50# x 15, 60# 2 x 15; Nautilus chest press 60# 3 x 15; Nautilus lat pull down 70# 3 x 15; Supine R shoulder serratus punch with manual resistance from therapist 2 x 10; Supine R shoulder rythymic stabilization 30s x 2; Supine R shoulder flexion with 5# dumbbell from neutral to 90 degrees of flexion 2 x 10; L sidelying R shoulder ER with manual resistance from therapist 2 x 15;   Pt educated throughout session about proper posture and technique with exercises. Improved exercise technique, movement at target joints, use of target muscles after min to mod verbal, visual, tactile cues.    Pt demonstrates excellent motivation during  session today.  Patient continues report between 50-75% improvement in his shoulder since starting therapy.  He no longer has any more resting shoulder pain however does still report pain when he reaches behind his back intermittently.  Continued with manual techniques today however added additional strengthening utilizing Nautilus machine today.  Patient is able to complete all exercises without any increase in his pain.  Patient encouraged to continue his HEP and follow-up as scheduled.  He reports none of the home exercises are  aggravating his pain with the exception of internal rotation stretches behind the back. Overall he is making progress with respect to R shoulder range of motion and pain and has yet to achieve maximal benefit in physical therapy services.  Will update his goals during next session and assess need for continuation therapy versus discharge.  Patient will continue to benefit from PT services to address deficits in shoulder mobility and strength in order to return to full function at home without pain.                             PT Short Term Goals - 03/19/20 0850      PT SHORT TERM GOAL #1   Title Pt will be independent with HEP in order to improve shoulder strength and decrease pain in order to improve pain-free function at home.    Time 4    Period Weeks    Status Achieved             PT Long Term Goals - 03/19/20 0850      PT LONG TERM GOAL #1   Title Pt will decrease worst shoulder pain as reported on NPRS by at least 3 points in order to demonstrate clinically significant reduction in pain.    Baseline 01/23/20: worst: 5/10; 03/11/20: 2-3/10;    Time 8    Period Weeks    Status Partially Met    Target Date 05/13/20      PT LONG TERM GOAL #2   Title Pt will increase strength of  R shoulder external by at least 1/2 MMT grade in order to demonstrate improvement in strength and function    Baseline 01/23/20: 4-/5 in sitting; 03/11/20: 4+/5;    Time 8    Period Weeks    Status Achieved      PT LONG TERM GOAL #3   Title Pt will be able to reach behind back to access back pocket, thread belt, and dry back after shower without increase in shoulder pain    Baseline 03/11/20: 75% improvement but still painful at certain points in the motion    Time 8    Period Weeks    Status Partially Met    Target Date 05/13/20                 Plan - 04/01/20 1017    Clinical Impression Statement Pt demonstrates excellent motivation during session today.  Patient  continues report between 50-75% improvement in his shoulder since starting therapy.  He no longer has any more resting shoulder pain however does still report pain when he reaches behind his back intermittently.  Continued with manual techniques today however added additional strengthening utilizing Nautilus machine today.  Patient is able to complete all exercises without any increase in his pain.  Patient encouraged to continue his HEP and follow-up as scheduled.  He reports none of the home exercises are aggravating his pain with  the exception of internal rotation stretches behind the back. Overall he is making progress with respect to R shoulder range of motion and pain and has yet to achieve maximal benefit in physical therapy services.  Will update his goals during next session and assess need for continuation therapy versus discharge.  Patient will continue to benefit from PT services to address deficits in shoulder mobility and strength in order to return to full function at home without pain.    Personal Factors and Comorbidities Age;Time since onset of injury/illness/exacerbation    Examination-Activity Limitations Bathing;Dressing    Stability/Clinical Decision Making Stable/Uncomplicated    Rehab Potential Excellent    PT Frequency 1x / week    PT Duration 8 weeks    PT Treatment/Interventions ADLs/Self Care Home Management;Aquatic Therapy;Biofeedback;Canalith Repostioning;Cryotherapy;Electrical Stimulation;Iontophoresis 66m/ml Dexamethasone;Moist Heat;Traction;Ultrasound;Gait training;Stair training;Functional mobility training;Therapeutic activities;Therapeutic exercise;Balance training;Neuromuscular re-education;Cognitive remediation;Patient/family education;Manual techniques;Passive range of motion;Dry needling;Vestibular;Joint Manipulations;Spinal Manipulations    PT Next Visit Plan Progress note, consider possible discharge, review HEP, progress R shoulder mobilization techniques,  stretches, and external rotation strengthening. Consider thoracic mobilizations    PT Home Exercise Plan Access Code: [email or web address]    Consulted and Agree with Plan of Care Patient           Patient will benefit from skilled therapeutic intervention in order to improve the following deficits and impairments:  Pain  Visit Diagnosis: Chronic right shoulder pain     Problem List There are no problems to display for this patient.  JLyndel SafeHuprich PT, DPT, GCS  Bryn Perkin 04/02/2020, 10:03 AM  [place] [facility] [place] [address]Phone: [phone]  Fax:  [phone] Name: Patrick CoppaMRN: [id number] of Birth: 412-08-1933

## 2020-04-29 ENCOUNTER — Ambulatory Visit: Payer: Medicare Other | Attending: Internal Medicine

## 2020-04-29 ENCOUNTER — Other Ambulatory Visit: Payer: Self-pay

## 2020-04-29 DIAGNOSIS — G8929 Other chronic pain: Secondary | ICD-10-CM | POA: Diagnosis present

## 2020-04-29 DIAGNOSIS — M25511 Pain in right shoulder: Secondary | ICD-10-CM | POA: Diagnosis present

## 2020-04-29 NOTE — Therapy (Signed)
[facility] [address] [place], [place], [address] Phone: [phone]   Fax:  [phone]  Physical Therapy Treatment/Progress Note/Discharge  Dates of reporting period  01/23/20   to   04/29/20  Patient Details  Name: Patrick Wu MRN: [id number] Date of Birth: [birth year] Referring Provider (PT): Dr. Emily Filbert   Encounter Date: 04/29/2020   PT End of Session - 04/29/20 1104    Visit Number 10    Number of Visits 17    Date for PT Re-Evaluation 05/13/20    Authorization Type eval: 01/23/20    PT Start Time 1020    PT Stop Time 1100    PT Time Calculation (min) 40 min    Activity Tolerance Patient tolerated treatment well    Behavior During Therapy [facility] for tasks assessed/performed           Past Medical History:  Diagnosis Date  . Asthma     Past Surgical History:  Procedure Laterality Date  . APPENDECTOMY      There were no vitals filed for this visit.   Subjective Assessment - 04/29/20 1054    Subjective Patient reports he is doing well today. No resting pain upon arrival. His shoulder pain is improving and he reports at least 75% improvement since starting therapy. He still has intermittent pain when reaching behind his back to reach into his back pocket or loop his belt in the back. No specific questions currently.    Pertinent History Pt reports that approximately 9 months ago he started having R shoulder pain. Pain is in the anterior aspect of shoulder and occasionally lateral near distal insertion of deltoid. Onset was insidious. He has a remote history of R shoulder bursitis (87 years old). He got injections and it resolved without any recurrence. No issues with neck injury, pain, or trauma. Shoulder symptoms have been unchanged recently. The pain is most notable when reaching behind his back to fasten his belt, dry his back, or access his back pocket. Pain will occasionally wake him up at night. He denies any  imaging of his R shoulder. Denies any additional recent changes in health or medications.    Limitations Lifting    Diagnostic tests None    Patient Stated Goals Decrease R shoulder pain and improve function when reaching behind back    Currently in Pain? No/denies               TREATMENT   Ther-ex  UBE x 4 minutes (2 minutes forward/2 minutes backwards) for warm-up duringhistory (2 minutes unbilled); Updated outcome measures and goals with patient: FOTO: 74 Worst pain: 2/10; Percent improvement: Over 75% improved since starting therapy;  MMT (see below): Strength R/L 5/5 Shoulder flexion (anterior deltoid/pec major/coracobrachialis, axillary n. (C5-6) and musculocutaneous n. (C5-7)) 5/5 Shoulder abduction (deltoid/supraspinatus, axillary/suprascapular n, C5) 5/5 Shoulder external rotation (infraspinatus/teres minor) 5/5 Shoulder internal rotation (subcapularis/lats/pec major) 5/5 Shoulder extension (posterior deltoid, lats, teres major, axillary/thoracodorsal n.) 4+/4+ Shoulder horizontal abduction (mid trap) 4/4 Low trap 5/5 Elbow flexion (biceps brachii, brachialis, brachioradialis, musculoskeletal n, C5-6) 5/5 Elbow extension (triceps, radial n, C7) 5/5 Wrist Extension 5/5 Wrist Flexion 5/5 Finger adduction (interossei, ulnar n, T1)  Nautilus lat pull down 70# x 20, 80# 2 x 20; Nautilus rows 60# x 20, 70# 2 x 20; Nautilus shoulder extension 50# 3 x 20; Nautilus chest press 60# 3 x 20;  HEP modification and review with patient;   Pt educated throughout session about proper  posture and technique with exercises. Improved exercise technique, movement at target joints, use of target muscles after min to mod verbal, visual, tactile cues.    Pt demonstrates excellent motivation during session today. Updated outcome measures and goals with patient during session. He reports decrease in worst shoulder pain from 5/10 at initial evaluation to 2/10 today and at least  75% improvement in symptoms. FOTO score improved to 83. Significant increase in R shoulder external rotation strength during testing today compared to initial evaluation. Performed Nautilus strengthening during session today without any increase in his pain but appropriate muscle fatigue. He has made excellent progress since starting therapy and will be discharged on this date. Pt encouraged to continue his HEP independently and follow-up with PCP if his pain worsens.                    PT Education - 04/29/20 1104    Education Details HEP modification and discharge    Person(s) Educated Patient    Methods Explanation    Comprehension Verbalized understanding            PT Short Term Goals - 04/29/20 1104      PT SHORT TERM GOAL #1   Title Pt will be independent with HEP in order to improve shoulder strength and decrease pain in order to improve pain-free function at home.    Time 4    Period Weeks    Status Achieved             PT Long Term Goals - 04/29/20 1024      PT LONG TERM GOAL #1   Title Pt will decrease worst shoulder pain as reported on NPRS by at least 3 points in order to demonstrate clinically significant reduction in pain.    Baseline 01/23/20: worst: 5/10; 03/11/20: 2-3/10; 04/29/20: 2/10    Time 8    Period Weeks    Status Achieved      PT LONG TERM GOAL #2   Title Pt will increase strength of  R shoulder external by at least 1/2 MMT grade in order to demonstrate improvement in strength and function    Baseline 01/23/20: 4-/5 in sitting; 03/11/20: 4+/5; 04/29/20: 5/5 (prone)    Time 8    Period Weeks    Status Achieved      PT LONG TERM GOAL #3   Title Pt will be able to reach behind back to access back pocket, thread belt, and dry back after shower without increase in shoulder pain    Baseline 03/11/20: 75% improvement but still painful at certain points in the motion; 04/29/20: At least 75% improvement but still painful occasionally when reaching  behind back;    Time 8    Period Weeks    Status Partially Met                 Plan - 04/29/20 1025    Clinical Impression Statement Pt demonstrates excellent motivation during session today. Updated outcome measures and goals with patient during session. He reports decrease in worst shoulder pain from 5/10 at initial evaluation to 2/10 today and at least 75% improvement in symptoms. FOTO score improved to 83. Significant increase in R shoulder external rotation strength during testing today compared to initial evaluation. Performed Nautilus strengthening during session today without any increase in his pain but appropriate muscle fatigue. He has made excellent progress since starting therapy and will be discharged on this date. Pt encouraged to continue  his HEP independently and follow-up with PCP if his pain worsens.    Personal Factors and Comorbidities Age;Time since onset of injury/illness/exacerbation    Examination-Activity Limitations Bathing;Dressing    Stability/Clinical Decision Making Stable/Uncomplicated    Rehab Potential Excellent    PT Frequency 1x / week    PT Duration 8 weeks    PT Treatment/Interventions ADLs/Self Care Home Management;Aquatic Therapy;Biofeedback;Canalith Repostioning;Cryotherapy;Electrical Stimulation;Iontophoresis 24m/ml Dexamethasone;Moist Heat;Traction;Ultrasound;Gait training;Stair training;Functional mobility training;Therapeutic activities;Therapeutic exercise;Balance training;Neuromuscular re-education;Cognitive remediation;Patient/family education;Manual techniques;Passive range of motion;Dry needling;Vestibular;Joint Manipulations;Spinal Manipulations    PT Next Visit Plan Discharge    PT Home Exercise Plan Access Code: [email or web address]    Consulted and Agree with Plan of Care Patient           Patient will benefit from skilled therapeutic intervention in order to improve the following deficits and impairments:  Pain  Visit Diagnosis: Chronic  right shoulder pain     Problem List There are no problems to display for this patient.  JLyndel SafeHuprich PT, DPT, GCS  Marques Ericson 04/30/2020, 12:02 PM   [facility]ML[facility] Ltd[address] [place] [address]Phone: [phone]  Fax:  [phone] Name: WDequann VanderveldenMRN: [id number] of Birth: [birth year]

## 2020-04-29 NOTE — Patient Instructions (Addendum)
Access Code: [email or web address] URL: [email or web address] Date: 04/29/2020 Prepared by: Roxana Hires  Exercises Seated Shoulder Abduction AAROM with Pulley Behind - 2 x daily - 7 x weekly - 2 sets - 10 reps - 3s hold Standing Shoulder Internal Rotation AAROM with Pulley - 1 x daily - 7 x weekly - 3 sets - 10 reps Shoulder External Rotation and Scapular Retraction with Resistance - 1 x daily - 4 x weekly - 2 sets - 10 reps - 3s hold Standing Shoulder Internal Rotation with Anchored Resistance - 1 x daily - 4 x weekly - 2 sets - 10 reps - 3s hold Standing Shoulder Flexion with Resistance - 1 x daily - 4 x weekly - 2 sets - 10 reps - 3s hold Standing Single Arm Shoulder Abduction with Resistance - 1 x daily - 4 x weekly - 2 sets - 10 reps - 3s hold Single Arm Shoulder Extension with Anchored Resistance - 1 x daily - 4 x weekly - 2 sets - 10 reps - 3s hold Standing Bilateral Low Shoulder Row with Anchored Resistance - 1 x daily - 4 x weekly - 2 sets - 10 reps - 3s hold Low Trap Setting at Hauser - 1 x daily - 4 x weekly - 2 sets - 10 reps - 3s hold Seated Lumbar Flexion Stretch - 2 x daily - 7 x weekly - 3 reps - 45s hold Hooklying Lumbar Rotation - 2 x daily - 7 x weekly - 3 reps - 30-45s x 3 toward each side hold Hooklying Single Knee to Chest Stretch - 2 x daily - 7 x weekly - 3 reps - 45s hold Supine Posterior Pelvic Tilt - 2 x daily - 7 x weekly - 2 sets - 10 reps - 3s hold Cat-Camel - 2 x daily - 7 x weekly - 2 sets - 10 reps - 3s hold

## 2020-06-23 ENCOUNTER — Other Ambulatory Visit: Payer: Self-pay | Admitting: Internal Medicine

## 2020-06-23 ENCOUNTER — Other Ambulatory Visit (HOSPITAL_COMMUNITY): Payer: Self-pay | Admitting: Internal Medicine

## 2020-06-23 ENCOUNTER — Ambulatory Visit: Admission: RE | Admit: 2020-06-23 | Payer: Medicare Other | Source: Ambulatory Visit

## 2020-06-23 DIAGNOSIS — G459 Transient cerebral ischemic attack, unspecified: Secondary | ICD-10-CM

## 2020-08-19 ENCOUNTER — Other Ambulatory Visit: Payer: Self-pay | Admitting: Neurology

## 2020-08-19 ENCOUNTER — Other Ambulatory Visit (HOSPITAL_COMMUNITY): Payer: Self-pay | Admitting: Neurology

## 2020-08-19 DIAGNOSIS — G459 Transient cerebral ischemic attack, unspecified: Secondary | ICD-10-CM

## 2020-08-27 ENCOUNTER — Ambulatory Visit
Admission: RE | Admit: 2020-08-27 | Discharge: 2020-08-27 | Disposition: A | Discharge status: Home / Self Care | Payer: Medicare Other | Source: Ambulatory Visit | Attending: Neurology | Admitting: Neurology

## 2020-08-27 ENCOUNTER — Other Ambulatory Visit: Payer: Self-pay

## 2020-08-27 DIAGNOSIS — G459 Transient cerebral ischemic attack, unspecified: Secondary | ICD-10-CM | POA: Insufficient documentation

## 2020-08-27 IMAGING — MR MR MRA HEAD W/O CM
1 series · 19 of 48 positions shown · non-contrast
Comparison: No pertinent prior exams available for comparison.

CLINICAL DATA: TIA (transient ischemic attack) CFZ.N (SUI-GA-CM).

EXAM:
MRA HEAD WITHOUT CONTRAST
TECHNIQUE: Angiographic images of the Circle of Willis were acquired using MRA
technique without intravenous contrast.

[Series 9: TOF · axial · 0.5mm · 0.41mm/px · z∈[-87,+10]mm · 19 of 205 slices shown]
[im 1/205]
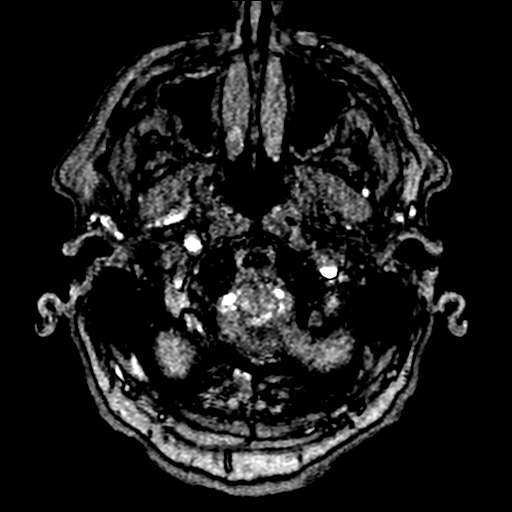
[im 5/205]
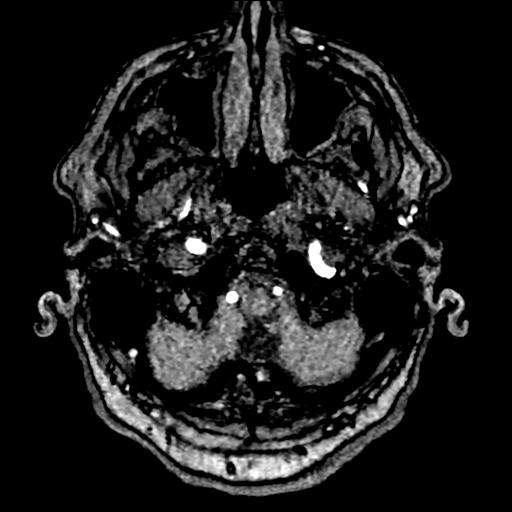
[im 9/205]
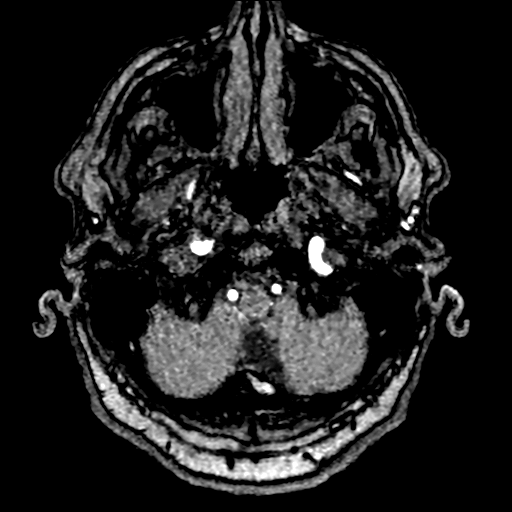
[im 14/205]
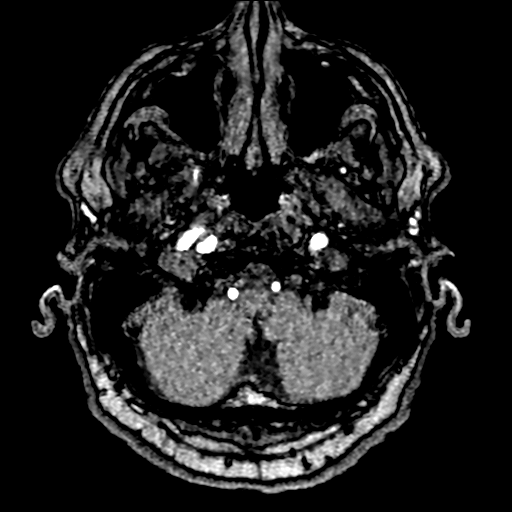
[im 18/205]
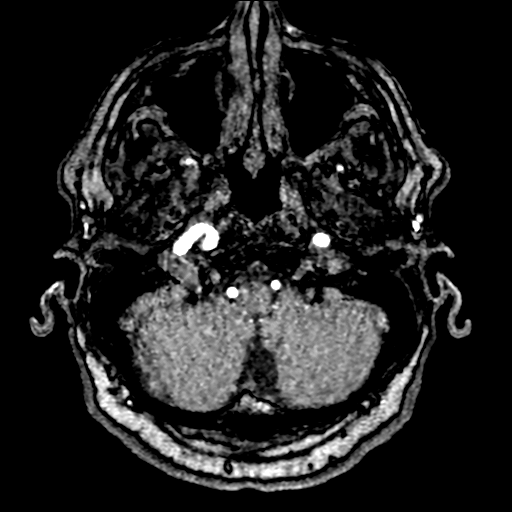
[im 22/205]
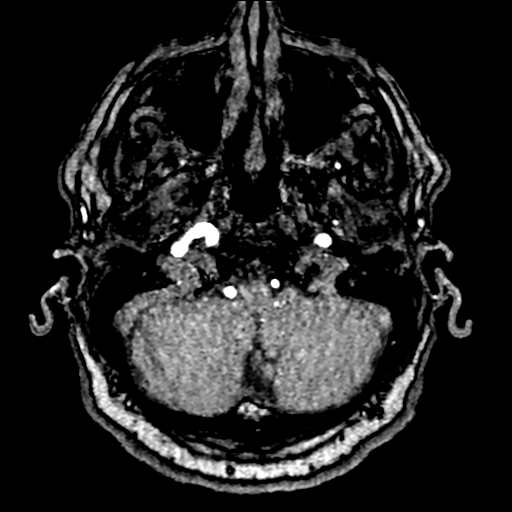
[im 27/205]
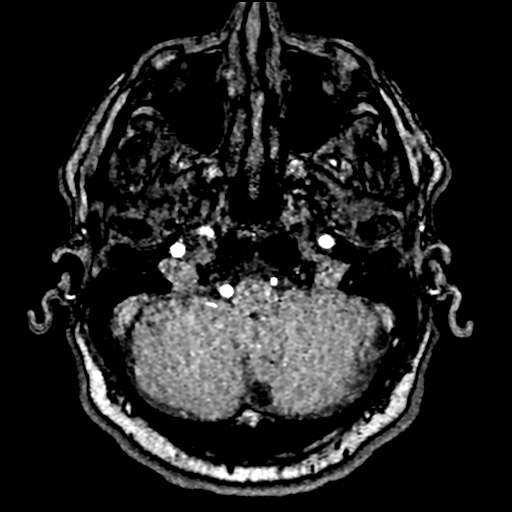
[im 31/205]
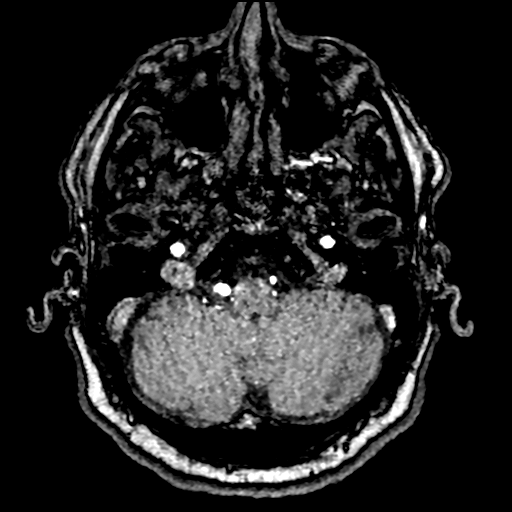
[im 35/205]
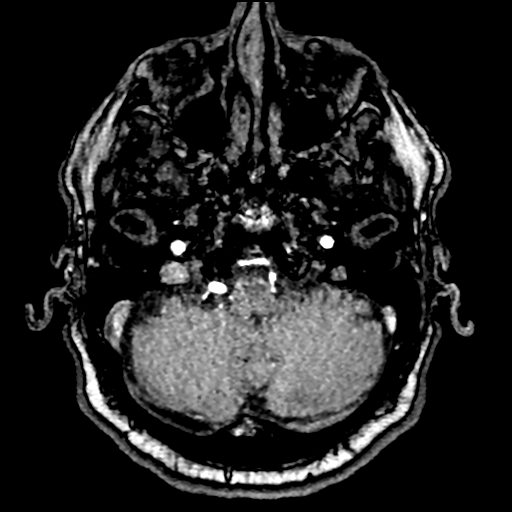
[im 40/205]
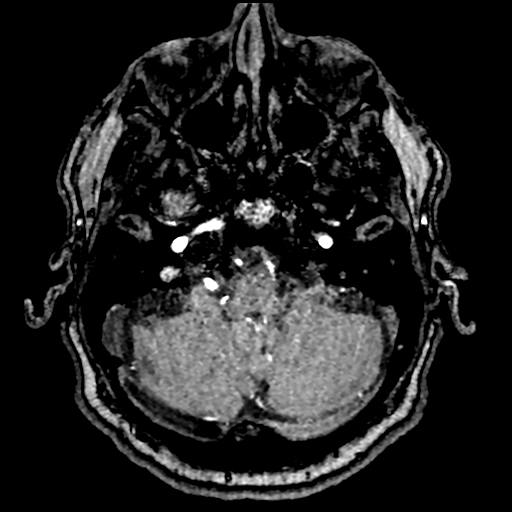
[im 44/205]
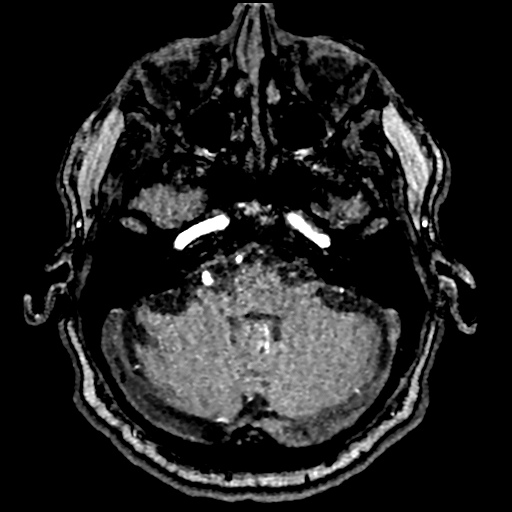
[im 66/205]
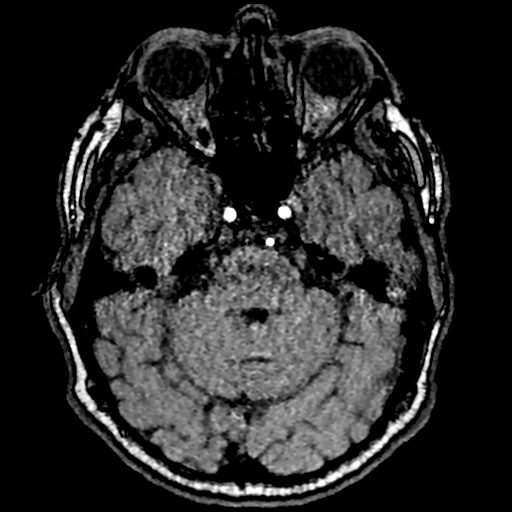
[im 92/205]
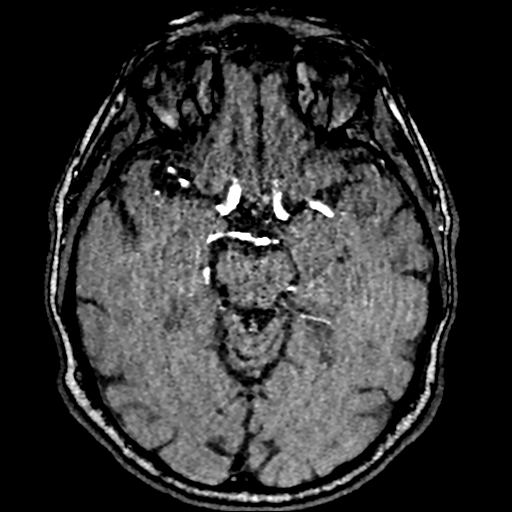
[im 105/205]
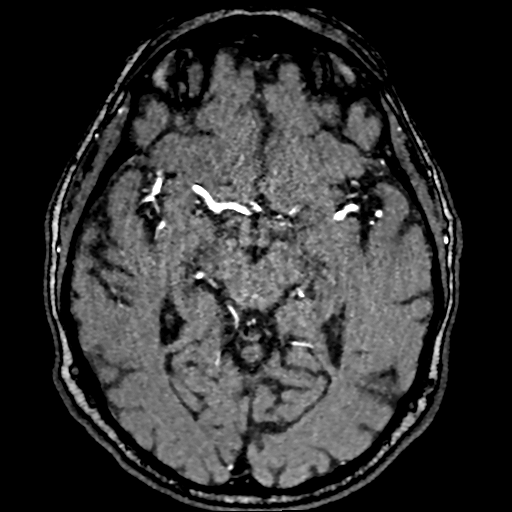
[im 118/205]
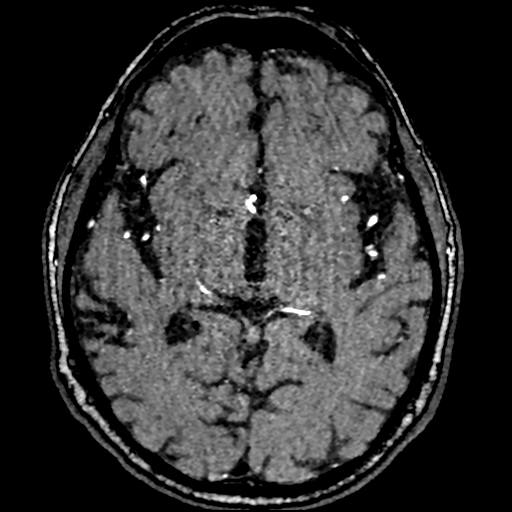
[im 144/205]
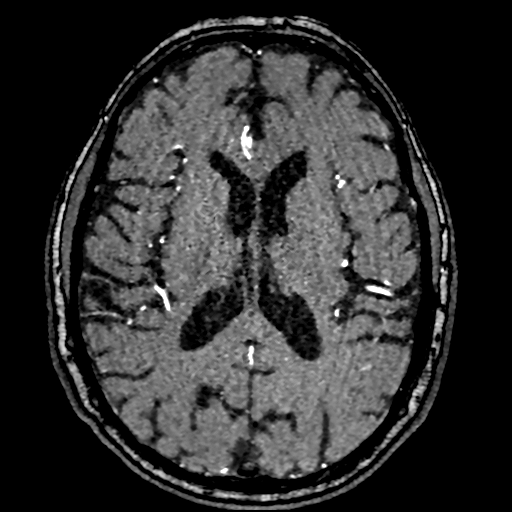
[im 170/205]
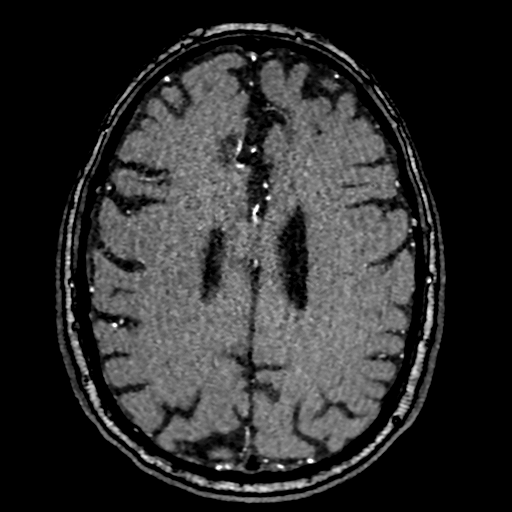
[im 174/205]
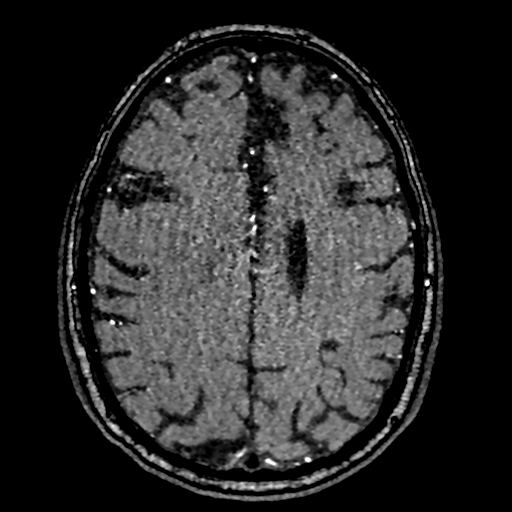
[im 196/205]
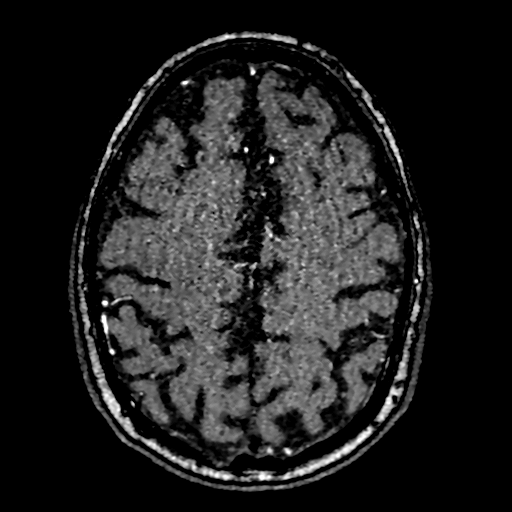

[19 of 48 positions shown; findings below may reference images not displayed]

FINDINGS: Anterior circulation:

The intracranial internal carotid arteries are patent. The M1 middle
cerebral arteries are patent. No M2 proximal branch occlusion or
high-grade proximal stenosis is identified. The anterior cerebral
arteries are patent.

1-2 mm aneurysm arising from the proximal cavernous left internal
carotid artery (series 9, image 77) (series 6376, image 182).

1-2 mm anterior communicating artery aneurysm versus bulbous
appearance of the anterior communicating artery (series 9, image
110) (series 2388, image 16).

Posterior circulation:

The intracranial vertebral arteries are patent. The basilar artery
is patent. The posterior cerebral arteries are patent. Both
posterior cerebral arteries demonstrate distal branch
atherosclerotic irregularity. Posterior communicating arteries are
present bilaterally, larger on the right.

1-2 mm aneurysm arising from the proximal basilar artery (series 9,
image 52) (series 0188, image 209).

Anatomic variants: None significant
IMPRESSION: 1. No intracranial large vessel occlusion or proximal high-grade
arterial stenosis.
2. Bilateral posterior cerebral artery distal branch atherosclerotic
irregularity.
3. 1-2 mm aneurysm arising from the proximal cavernous left internal
carotid artery.
4. 1-2 mm aneurysm arising from the proximal basilar artery.
5. 1-2 mm anterior communicating artery aneurysm versus bulbous
appearance of the anterior communicating artery.

## 2021-02-18 ENCOUNTER — Encounter: Payer: Self-pay | Admitting: Dermatology

## 2021-02-18 ENCOUNTER — Ambulatory Visit (INDEPENDENT_AMBULATORY_CARE_PROVIDER_SITE_OTHER): Payer: Medicare Other | Admitting: Dermatology

## 2021-02-18 ENCOUNTER — Other Ambulatory Visit: Payer: Self-pay

## 2021-02-18 DIAGNOSIS — L570 Actinic keratosis: Secondary | ICD-10-CM | POA: Diagnosis not present

## 2021-02-18 DIAGNOSIS — L578 Other skin changes due to chronic exposure to nonionizing radiation: Secondary | ICD-10-CM

## 2021-02-18 DIAGNOSIS — Z85828 Personal history of other malignant neoplasm of skin: Secondary | ICD-10-CM | POA: Diagnosis not present

## 2021-02-18 DIAGNOSIS — D239 Other benign neoplasm of skin, unspecified: Secondary | ICD-10-CM

## 2021-02-18 DIAGNOSIS — D2371 Other benign neoplasm of skin of right lower limb, including hip: Secondary | ICD-10-CM

## 2021-02-18 DIAGNOSIS — L821 Other seborrheic keratosis: Secondary | ICD-10-CM

## 2021-02-18 DIAGNOSIS — D229 Melanocytic nevi, unspecified: Secondary | ICD-10-CM

## 2021-02-18 DIAGNOSIS — Z1283 Encounter for screening for malignant neoplasm of skin: Secondary | ICD-10-CM

## 2021-02-18 DIAGNOSIS — C44519 Basal cell carcinoma of skin of other part of trunk: Secondary | ICD-10-CM

## 2021-02-18 DIAGNOSIS — L814 Other melanin hyperpigmentation: Secondary | ICD-10-CM

## 2021-02-18 DIAGNOSIS — D1800 Hemangioma unspecified site: Secondary | ICD-10-CM

## 2021-02-18 DIAGNOSIS — D492 Neoplasm of unspecified behavior of bone, soft tissue, and skin: Secondary | ICD-10-CM

## 2021-02-18 NOTE — Progress Notes (Signed)
New Patient Visit  Subjective  Patrick Wu is a 88 y.o. male who presents for the following: Annual Exam (Here for skin cancer screening. Full body. Hx BCC on forehead).  The patient presents for Total-Body Skin Exam (TBSE) for skin cancer screening and mole check.  The patient has spots, moles and lesions to be evaluated, some may be new or changing and the patient has concerns that these could be cancer.  He does note a painful spot on his foot.   Review of Systems: No other skin or systemic complaints except as noted in HPI or Assessment and Plan.   Objective  Well appearing patient in no apparent distress; mood and affect are within normal limits.  A full examination was performed including scalp, head, eyes, ears, nose, lips, neck, chest, axillae, abdomen, back, buttocks, bilateral upper extremities, bilateral lower extremities, hands, feet, fingers, toes, fingernails, and toenails. All findings within normal limits unless otherwise noted below.  face Erythematous thin papules/macules with gritty scale.   Right 5th Metatarsal Plantar Area x1 Thickened firm plaque  Right Upper Back 0.5cm red papule        Assessment & Plan   Lentigines - Scattered tan macules - Due to sun exposure - Benign-appearing, observe - Recommend daily broad spectrum sunscreen SPF 30+ to sun-exposed areas, reapply every 2 hours as needed. - Call for any changes  Seborrheic Keratoses - Stuck-on, waxy, tan-brown papules and/or plaques  - Benign-appearing - Discussed benign etiology and prognosis. - Observe - Call for any changes  Melanocytic Nevi - Tan-brown and/or pink-flesh-colored symmetric macules and papules - Benign appearing on exam today - Observation - Call clinic for new or changing moles - Recommend daily use of broad spectrum spf 30+ sunscreen to sun-exposed areas.   Hemangiomas - Red papules - Discussed benign nature - Observe - Call for any changes  Actinic  Damage - Severe, confluent actinic changes with pre-cancerous actinic keratoses  - Severe, chronic, not at goal, secondary to cumulative UV radiation exposure over time - diffuse scaly erythematous macules and papules with underlying dyspigmentation - Discussed Prescription "Field Treatment" for Severe, Chronic Confluent Actinic Changes with Pre-Cancerous Actinic Keratoses Field treatment involves treatment of an entire area of skin that has confluent Actinic Changes (Sun/ Ultraviolet light damage) and PreCancerous Actinic Keratoses by method of PhotoDynamic Therapy (PDT) and/or prescription Topical Chemotherapy agents such as 5-fluorouracil, 5-fluorouracil/calcipotriene, and/or imiquimod.  The purpose is to decrease the number of clinically evident and subclinical PreCancerous lesions to prevent progression to development of skin cancer by chemically destroying early precancer changes that may or may not be visible.  It has been shown to reduce the risk of developing skin cancer in the treated area. As a result of treatment, redness, scaling, crusting, and open sores may occur during treatment course. One or more than one of these methods may be used and may have to be used several times to control, suppress and eliminate the PreCancerous changes. Discussed treatment course, expected reaction, and possible side effects. - Recommend daily broad spectrum sunscreen SPF 30+ to sun-exposed areas, reapply every 2 hours as needed.  - Staying in the shade or wearing long sleeves, sun glasses (UVA+UVB protection) and wide brim hats (4-inch brim around the entire circumference of the hat) are also recommended. - Call for new or changing lesions.   History of Basal Cell Carcinoma of the Skin - No evidence of recurrence today at forehead/glabella - Recommend regular full body skin exams - Recommend daily  broad spectrum sunscreen SPF 30+ to sun-exposed areas, reapply every 2 hours as needed.  - Call if any new or  changing lesions are noted between office visits   Skin cancer screening performed today.  AK (actinic keratosis) face  Actinic keratoses are precancerous spots that appear secondary to cumulative UV radiation exposure/sun exposure over time. They are chronic with expected duration over 1 year. A portion of actinic keratoses will progress to squamous cell carcinoma of the skin. It is not possible to reliably predict which spots will progress to skin cancer and so treatment is recommended to prevent development of skin cancer.  Recommend daily broad spectrum sunscreen SPF 30+ to sun-exposed areas, reapply every 2 hours as needed.  Recommend staying in the shade or wearing long sleeves, sun glasses (UVA+UVB protection) and wide brim hats (4-inch brim around the entire circumference of the hat). Call for new or changing lesions.  Start 5-fluorouracil/calcipotriene cream twice a day for 4 days to affected areas including face. Prescription sent to [facility]. Patient advised they will receive an email to purchase the medication online and have it sent to their home. Patient provided with handout reviewing treatment course and side effects and advised to call or message [place] on MyChart with any concerns.   Benign neoplasm of skin, unspecified location Right 5th Metatarsal Plantar Area x1  Consistent with callus. Symptomatic.   Recommend using OTC Lidocaine topical to help with pain at area.   Treatment options discussed. Patient prefers LN2 today.  Prior to procedure, discussed risks of blister formation, small wound, skin dyspigmentation, or rare scar following cryotherapy. Recommend Vaseline ointment to treated areas while healing.   Destruction of lesion - Right 5th Metatarsal Plantar Area x1  Destruction method: cryotherapy   Informed consent: discussed and consent obtained   Lesion destroyed using liquid nitrogen: Yes   Outcome: patient tolerated procedure well  with no complications   Post-procedure details: wound care instructions given    Neoplasm of skin Right Upper Back  Epidermal / dermal shaving  Lesion diameter (cm):  0.5 Informed consent: discussed and consent obtained   Patient was prepped and draped in usual sterile fashion: Area prepped with alcohol. Anesthesia: the lesion was anesthetized in a standard fashion   Anesthetic:  1% lidocaine w/ epinephrine 1-100,000 buffered w/ 8.4% NaHCO3 Instrument used: flexible razor blade   Hemostasis achieved with: pressure, aluminum chloride and electrodesiccation   Outcome: patient tolerated procedure well   Post-procedure details: wound care instructions given   Post-procedure details comment:  Ointment and small bandage applied  Destruction of lesion  Destruction method: electrodesiccation and curettage   Informed consent: discussed and consent obtained   Timeout:  patient name, date of birth, surgical site, and procedure verified Anesthesia: the lesion was anesthetized in a standard fashion   Anesthetic:  1% lidocaine w/ epinephrine 1-100,000 buffered w/ 8.4% NaHCO3 Curettage performed in three different directions: Yes   Electrodesiccation performed over the curetted area: Yes   Curettage cycles:  3 Lesion length (cm):  0.5 Lesion width (cm):  0.5 Final wound size (cm):  1.2 Hemostasis achieved with:  electrodesiccation Outcome: patient tolerated procedure well with no complications   Post-procedure details: sterile dressing applied and wound care instructions given   Dressing type: petrolatum    Specimen 1 - Surgical pathology Differential Diagnosis: R/O BCC  Check Margins: No   Return in about 6 months (around 08/18/2021) for TBSE, AK Follow Up .  I, Emelia Salisbury, CMA, am  acting as scribe for Forest Gleason, MD.  Documentation: I have reviewed the above documentation for accuracy and completeness, and I agree with the above.  Forest Gleason, MD

## 2021-02-18 NOTE — Patient Instructions (Addendum)
Wound Care Instructions  Cleanse wound gently with soap and water once a day then pat dry with clean gauze. Apply a thing coat of Petrolatum (petroleum jelly, "Vaseline") over the wound (unless you have an allergy to this). We recommend that you use a new, sterile tube of Vaseline. Do not pick or remove scabs. Do not remove the yellow or white "healing tissue" from the base of the wound.  Cover the wound with fresh, clean, nonstick gauze and secure with paper tape. You may use Band-Aids in place of gauze and tape if the would is small enough, but would recommend trimming much of the tape off as there is often too much. Sometimes Band-Aids can irritate the skin.  You should call the office for your biopsy report after 1 week if you have not already been contacted.  If you experience any problems, such as abnormal amounts of bleeding, swelling, significant bruising, significant pain, or evidence of infection, please call the office immediately.  FOR ADULT SURGERY PATIENTS: If you need something for pain relief you may take 1 extra strength Tylenol (acetaminophen) AND 2 Ibuprofen (200mg  each) together every 4 hours as needed for pain. (do not take these if you are allergic to them or if you have a reason you should not take them.) Typically, you may only need pain medication for 1 to 3 days.     Recommend taking Heliocare sun protection supplement daily in sunny weather for additional sun protection. For maximum protection on the sunniest days, you can take up to 2 capsules of regular Heliocare OR take 1 capsule of Heliocare Ultra. For prolonged exposure (such as a full day in the sun), you can repeat your dose of the supplement 4 hours after your first dose. Heliocare can be purchased at [employer], at some [facility] or at [email or web address].    - Start 5-fluorouracil/calcipotriene cream twice a day for 4 days to face. Once you have finished treatment of your face, you can use it twice a day  for 7 days to your right hand and arm extending half-way up your upper arm.   After the areas have healed (about 1 month after finishing treatment), if you notice any areas that are still rough, you can repeat the treatment with the cream. Please let us know if you have any concerns.   Prescription sent to [facility]. Patient advised they will receive an email to purchase the medication online and have it sent to their home. Patient provided with handout reviewing treatment course and side effects and advised to call or message [place] on MyChart with any concerns.   5-Fluorouracil/Calcipotriene Patient Education   Actinic keratoses are the dry, red scaly spots on the skin caused by sun damage. A portion of these spots can turn into skin cancer with time, and treating them can help prevent development of skin cancer.   Treatment of these spots requires removal of the defective skin cells. There are various ways to remove actinic keratoses, including freezing with liquid nitrogen, treatment with creams, or treatment with a blue light procedure in the office.   5-fluorouracil cream is a topical cream used to treat actinic keratoses. It works by interfering with the growth of abnormal fast-growing skin cells, such as actinic keratoses. These cells peel off and are replaced by healthy ones.   5-fluorouracil/calcipotriene is a combination of the 5-fluorouracil cream with a vitamin D analog cream called calcipotriene. The calcipotriene alone does not treat actinic keratoses. However, when  it is combined with 5-fluorouracil, it helps the 5-fluorouracil treat the actinic keratoses much faster so that the same results can be achieved with a much shorter treatment time.  INSTRUCTIONS FOR 5-FLUOROURACIL/CALCIPOTRIENE CREAM:   5-fluorouracil/calcipotriene cream typically only needs to be used for 4-7 days. A thin layer should be applied twice a day to the treatment areas recommended by  your physician.   If your physician prescribed you separate tubes of 5-fluourouracil and calcipotriene, apply a thin layer of 5-fluorouracil followed by a thin layer of calcipotriene.   Avoid contact with your eyes, nostrils, and mouth. Do not use 5-fluorouracil/calcipotriene cream on infected or open wounds.   You will develop redness, irritation and some crusting at areas where you have pre-cancer damage/actinic keratoses. IF YOU DEVELOP PAIN, BLEEDING, OR SIGNIFICANT CRUSTING, STOP THE TREATMENT EARLY - you have already gotten a good response and the actinic keratoses should clear up well.  Wash your hands after applying 5-fluorouracil 5% cream on your skin.   A moisturizer or sunscreen with a minimum SPF 30 should be applied each morning.   Once you have finished the treatment, you can apply a thin layer of Vaseline twice a day to irritated areas to soothe and calm the areas more quickly. If you experience significant discomfort, contact your physician.  For some patients it is necessary to repeat the treatment for best results.  SIDE EFFECTS: When using 5-fluorouracil/calcipotriene cream, you may have mild irritation, such as redness, dryness, swelling, or a mild burning sensation. This usually resolves within 2 weeks. The more actinic keratoses you have, the more redness and inflammation you can expect during treatment. Eye irritation has been reported rarely. If this occurs, please let us know.   If you have any trouble using this cream, please call the office. If you have any other questions about this information, please do not hesitate to ask me before you leave the office.     Recommend Band-Aid Brand Hydro Seal Blister Band-aids for area on foot.      Melanoma ABCDEs  Melanoma is the most dangerous type of skin cancer, and is the leading cause of death from skin disease.  You are more likely to develop melanoma if you: Have light-colored skin, light-colored eyes, or red or  blond hair Spend a lot of time in the sun Tan regularly, either outdoors or in a tanning bed Have had blistering sunburns, especially during childhood Have a close family member who has had a melanoma Have atypical moles or large birthmarks  Early detection of melanoma is key since treatment is typically straightforward and cure rates are extremely high if we catch it early.   The first sign of melanoma is often a change in a mole or a new dark spot.  The ABCDE system is a way of remembering the signs of melanoma.  A for asymmetry:  The two halves do not match. B for border:  The edges of the growth are irregular. C for color:  A mixture of colors are present instead of an even brown color. D for diameter:  Melanomas are usually (but not always) greater than 51mm - the size of a pencil eraser. E for evolution:  The spot keeps changing in size, shape, and color.  Please check your skin once per month between visits. You can use a small mirror in front and a large mirror behind you to keep an eye on the back side or your body.   If you see any new  or changing lesions before your next follow-up, please call to schedule a visit.  Please continue daily skin protection including broad spectrum sunscreen SPF 30+ to sun-exposed areas, reapplying every 2 hours as needed when you're outdoors.   Staying in the shade or wearing long sleeves, sun glasses (UVA+UVB protection) and wide brim hats (4-inch brim around the entire circumference of the hat) are also recommended for sun protection.    Recommend daily broad spectrum sunscreen SPF 30+ to sun-exposed areas, reapply every 2 hours as needed. Call for new or changing lesions.  Staying in the shade or wearing long sleeves, sun glasses (UVA+UVB protection) and wide brim hats (4-inch brim around the entire circumference of the hat) are also recommended for sun protection.   If You Need Anything After Your Visit  If you have any questions or concerns  for your doctor, please call our main line at [phone] and press option 4 to reach your doctor's medical assistant. If no one answers, please leave a voicemail as directed and we will return your call as soon as possible. Messages left after 4 pm will be answered the following business day.   You may also send [place] a message via [place]. We typically respond to MyChart messages within 1-2 business days.  For prescription refills, please ask your pharmacy to contact our office. Our fax number is [phone].  If you have an urgent issue when the clinic is closed that cannot wait until the next business day, you can page your doctor at the number below.    Please note that while we do our best to be available for urgent issues outside of office hours, we are not available 24/7.   If you have an urgent issue and are unable to reach [place], you may choose to seek medical care at your doctor's office, retail clinic, urgent care center, or emergency room.  If you have a medical emergency, please immediately call 911 or go to the emergency department.  Pager Numbers  - Dr. Nehemiah Massed: [phone]  - Dr. Laurence Ferrari: [phone]  - Dr. Nicole Kindred: [phone]  In the event of inclement weather, please call our main line at [phone] for an update on the status of any delays or closures.  Dermatology Medication Tips: Please keep the boxes that topical medications come in in order to help keep track of the instructions about where and how to use these. Pharmacies typically print the medication instructions only on the boxes and not directly on the medication tubes.   If your medication is too expensive, please contact our office at [phone] option 4 or send [place] a message through [place].   We are unable to tell what your co-pay for medications will be in advance as this is different depending on your insurance coverage. However, we may be able to find a substitute medication at lower cost or fill out  paperwork to get insurance to cover a needed medication.   If a prior authorization is required to get your medication covered by your insurance company, please allow [place] 1-2 business days to complete this process.  Drug prices often vary depending on where the prescription is filled and some pharmacies may offer cheaper prices.  The website www.goodrx.com contains coupons for medications through different pharmacies. The prices here do not account for what the cost may be with help from insurance (it may be cheaper with your insurance), but the website can give you the price if you did not use any insurance.  -  You can print the associated coupon and take it with your prescription to the pharmacy.  - You may also stop by our office during regular business hours and pick up a GoodRx coupon card.  - If you need your prescription sent electronically to a different pharmacy, notify our office through [facility] or by phone at [phone] option 4.     Si Usted Necesita Algo Despus de Su Visita  Tambin puede enviarnos un mensaje a travs de Pharmacist, community. Por lo general respondemos a los mensajes de MyChart en el transcurso de 1 a 2 das hbiles.  Para renovar recetas, por favor pida a su farmacia que se ponga en contacto con nuestra oficina. Harland Dingwall de fax es [place] [phone].  Si tiene un asunto urgente cuando la clnica est cerrada y que no puede esperar hasta el siguiente da hbil, puede llamar/localizar a su doctor(a) al nmero que aparece a continuacin.   Por favor, tenga en cuenta que aunque hacemos todo lo posible para estar disponibles para asuntos urgentes fuera del horario de [place], no estamos disponibles las 24 horas del da, los 7 das de la [place].   Si tiene un problema urgente y no puede comunicarse con nosotros, puede optar por buscar atencin mdica  en el consultorio de su doctor(a), en una clnica privada, en un centro de atencin urgente o en una sala de  emergencias.  Si tiene Engineering geologist, por favor llame inmediatamente al 911 o vaya a la sala de emergencias.  Nmeros de bper  - Dr. Nehemiah Massed: [phone]  - Dra. Moye: [phone]  - Dra. Nicole Kindred: [phone]  En caso de inclemencias del [place], por favor llame a Johnsie Kindred principal al [phone] para una actualizacin sobre el [place] de cualquier retraso o cierre.  Consejos para la medicacin en dermatologa: Por favor, guarde las cajas en las que vienen los medicamentos de uso tpico para ayudarle a seguir las instrucciones sobre dnde y cmo usarlos. Las farmacias generalmente imprimen las instrucciones del medicamento slo en las cajas y no directamente en los tubos del [place].   Si su medicamento es muy caro, por favor, pngase en contacto con Zigmund Daniel llamando al [phone] y presione la opcin 4 o envenos un mensaje a travs de Pharmacist, community.   No podemos decirle cul ser su copago por los medicamentos por adelantado ya que esto es diferente dependiendo de la cobertura de su seguro. Sin embargo, es posible que podamos encontrar un medicamento sustituto a Electrical engineer un formulario para que el seguro cubra el medicamento que se considera necesario.   Si se requiere una autorizacin previa para que su compaa de seguros [place] su medicamento, por favor permtanos de 1 a 2 das hbiles para completar este proceso.  Los precios de los medicamentos varan con frecuencia dependiendo del Environmental consultant de dnde se surte la receta y alguna farmacias pueden ofrecer precios ms baratos.  El sitio web www.goodrx.com tiene cupones para medicamentos de Airline pilot. Los precios aqu no tienen en cuenta lo que podra costar con la ayuda del seguro (puede ser ms barato con su seguro), pero el sitio web puede darle el precio si no utiliz Research scientist (physical sciences).  - Puede imprimir el cupn correspondiente y llevarlo con su receta a la farmacia.  - Tambin puede pasar por  nuestra oficina durante el horario de atencin regular y Charity fundraiser una tarjeta de cupones de GoodRx.  - Si necesita que su receta se enve electrnicamente a una farmacia diferente, informe  a nuestra oficina a travs de MyChart de [place] o por telfono llamando al [phone] y presione la opcin 4.

## 2021-02-22 ENCOUNTER — Telehealth: Payer: Self-pay

## 2021-02-22 NOTE — Telephone Encounter (Signed)
Advised pt of bx results/sh ?

## 2021-02-22 NOTE — Telephone Encounter (Signed)
-----   Message from Alfonso Patten, MD sent at 02/22/2021  4:10 PM EST ----- Skin , right upper back SUPERFICIAL BASAL CELL CARCINOMA, PERIPHERAL MARGIN INVOLVED --> already treated with [facility] will recheck at f/u  MAs please call. Thank you!

## 2021-02-27 ENCOUNTER — Encounter: Payer: Self-pay | Admitting: Dermatology

## 2021-03-10 ENCOUNTER — Telehealth: Payer: Self-pay

## 2021-03-10 NOTE — Telephone Encounter (Signed)
Yes, absolutely. Thank you! ?

## 2021-03-10 NOTE — Telephone Encounter (Signed)
Patient is scheduled for PDT ?

## 2021-03-10 NOTE — Telephone Encounter (Signed)
Patient came in today to talk about 5FU Cream VS PDT. Patient has decided he does not want to do the cream at this time. He has read that this can be life threatening to animals and he sleeps with a dog at his face every night. ? ?Are you OK with patient scheduling the Blue light treatment instead?  ?

## 2021-03-11 ENCOUNTER — Other Ambulatory Visit: Payer: Self-pay

## 2021-03-11 ENCOUNTER — Ambulatory Visit (INDEPENDENT_AMBULATORY_CARE_PROVIDER_SITE_OTHER): Payer: Medicare Other | Admitting: Dermatology

## 2021-03-11 DIAGNOSIS — L570 Actinic keratosis: Secondary | ICD-10-CM | POA: Diagnosis not present

## 2021-03-11 MED ORDER — AMINOLEVULINIC ACID HCL 20 % EX SOLR
1.0000 "application " | Freq: Once | CUTANEOUS | Status: AC
  Administered 2021-03-11: 354 mg via TOPICAL

## 2021-03-11 NOTE — Progress Notes (Signed)
Patient completed PDT therapy today. ? ?1. AK (actinic keratosis) ?Head - Anterior (Face) ? ?Photodynamic therapy - Head - Anterior (Face) ?Procedure discussed: discussed risks, benefits, side effects. and alternatives   ?Prep: site scrubbed/prepped with acetone   ?Location:  Face ?Number of lesions:  Multiple ?Type of treatment:  Blue light ?Aminolevulinic Acid (see MAR for details): Levulan ?Number of Levulan sticks used:  1 ?Incubation time (minutes):  60 ?Number of minutes under lamp:  16 ?Number of seconds under lamp:  40 ?Cooling:  Floor fan ?Outcome: patient tolerated procedure well with no complications   ?Post-procedure details: sunscreen applied   ? ?Aminolevulinic Acid HCl 20 % SOLR 354 mg - Head - Anterior (Face) ? ? ?Patient provided samples of solbar sunscreen and vanicream moisturizer and face wash.  ? ?Johnsie Kindred, RMA ? ?Documentation: I have reviewed the above documentation for accuracy and completeness, and I agree with the above. ? ?Forest Gleason, MD ? ?

## 2021-03-11 NOTE — Patient Instructions (Signed)

## 2021-03-12 ENCOUNTER — Encounter: Payer: Self-pay | Admitting: Dermatology

## 2021-03-22 ENCOUNTER — Telehealth: Payer: Self-pay | Admitting: Dermatology

## 2021-03-22 NOTE — Telephone Encounter (Signed)
Patient state that his PCP is Dr. Emily Filbert at [facility]. Message sent via Epic to Dr. Sabra Heck. Patient also wanted to know why his pathology results were still unavailable on MyChart. I released results to MyChart for patient.  ?

## 2021-03-22 NOTE — Telephone Encounter (Signed)
Please call PCP to see if Dr. Georgina Snell has any concerns about Patrick Wu starting niacinamide/nicotinamide 500 mg bid to lower his risk of making additional non-melanoma skin cancers? He has recently had 2 BCCs.  ? ?If ok per PCP, please let patient know. ? ?Thank you! ?

## 2021-03-25 ENCOUNTER — Other Ambulatory Visit: Payer: Self-pay

## 2021-03-25 ENCOUNTER — Telehealth: Payer: Self-pay

## 2021-03-25 NOTE — Telephone Encounter (Signed)
-----   Message from Rusty Aus, MD sent at 03/22/2021  5:34 PM EDT ----- ?Regarding: RE: Niacinamide ?That would be fine - thanks ?----- Message ----- ?From: Rosaria Ferries, CMA ?Sent: 03/22/2021  10:40 AM EDT ?To: Rusty Aus, MD ?Subject: Niacinamide                                   ? ?Good morning Dr. Sabra Heck,  ? ?Dr. Laurence Ferrari wanted me to reach out to you in regards to a mutual patient to see if you have any concerns about Mr. Congrove starting niacinamide/nicotinamide 500 mg bid to lower his risk of making additional non-melanoma skin cancers? He has recently had 2 BCCs. Thank you for your time.  ? ? ? ?

## 2021-03-25 NOTE — Telephone Encounter (Signed)
Patient advised that Dr. Sabra Heck is [facility] with him using Niacinamide '500mg'$  po BID. Patient states that he will do some research of his own and likely start over the counter medication.  ?

## 2021-03-25 NOTE — Progress Notes (Signed)
Patient states that he is now on Areds 2 supplement for macular degeneration.  ?

## 2021-03-30 ENCOUNTER — Telehealth: Payer: Self-pay

## 2021-03-30 NOTE — Telephone Encounter (Signed)
This pt called to schedule PDT on his arms, called pt he will check his schedule and call back to reschedule  ?

## 2021-04-06 ENCOUNTER — Telehealth: Payer: Self-pay

## 2021-04-06 NOTE — Telephone Encounter (Signed)
Patient called to schedule PDT to arms and hands. On his last after visit summary it is noted that patient could use 5FU cream to hands after finishing face treatment. Patient decided to do PDT instead of cream (see telephone call note from Dr. Laurence Ferrari). After discussing with patient he did have any skin issues after treating face with photodynamic therapy but he had issues with his eyes for about three days after.  ? ?Advised patient lets keep follow up with Dr. Laurence Ferrari and if she determines OK to schedule PDT to arms in the fall that this can be scheduled. aw ?

## 2021-05-31 NOTE — Therapy (Unsigned)
OUTPATIENT PHYSICAL THERAPY KNEE EVALUATION   Patient Name: Patrick Wu MRN: [id number] DOB:09-28-1933, 88 y.o., male Today's Date: 06/02/2021   PT End of Session - 06/02/21 1307     Visit Number 1    Number of Visits 7    Date for PT Re-Evaluation 07/13/21    Authorization Type eval: 06/01/21    PT Start Time 1100    PT Stop Time 1145    PT Time Calculation (min) 45 min    Activity Tolerance Patient tolerated treatment well    Behavior During Therapy [facility] for tasks assessed/performed             Past Medical History:  Diagnosis Date   Asthma    Basal cell carcinoma 03/26/2019   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   PCP: Assunta Gambles, MD  REFERRING PROVIDER: Leim Fabry, MD  REFERRING DIAGNOSIS: M25.561 (ICD-10-CM) - Pain in right knee  THERAPY DIAG: Chronic pain of right knee  RATIONALE FOR EVALUATION AND TREATMENT: Rehabilitation  ONSET DATE: 03/10/21 (approximate)  FOLLOW UP APPT WITH PROVIDER: No, pt advised to call as needed   SUBJECTIVE:                                                                                                                                                                                         Chief Complaint: R knee pain  Pertinent History Patrick Wu is a 88 y.o. male self-referred to Dr. Leim Fabry with [facility] orthopedics due to R knee pain. Symptoms initially began 7 years prior. At that time, he was evaluated at [facility] by Dr. Marlou Starks. An MRI from 2016 showed a horizontal tear of the posterior horn of the medial meniscus, a horizontal tear of the anterior horn/body junction of the lateral meniscus, and early tricompartmenal degenerative changes of the knee, most prominently affecting the patellofemoral compartment. He had a corticosteroid  injection with resolution of his pain until around the beginning of March 2023 when his symptoms returned. At the time his symptoms returned he reports very intermittent and sudden onset of R knee pain when walking. Symptoms would almost immediately resolve. He saw Dr. Posey Pronto who injected his R knee with 1 mL of Kenalog '40mg'$ , 2 mL of 1% Lidocaine, and 2 mL of 0.5% Ropivicaine without complication. He was referred for physical therapy to work on core, hip, and knee strengthening exercises. He is a professor Doctor, general practice at [employer].  Pain:  Pain Intensity: Present: 0/10, Best: 0/10, Worst: 10/10 (none recently) Pain location: R knee joint, pt has difficulty localizing pain when it occurs because it is brief Pain quality: sharp  Radiating pain: No  Swelling: Yes, chronic ongoing swelling in R knee Popping, catching, locking: Yes, momentary catch but no locking when pain occurs Numbness/Tingling: Yes, chronic ongoing BLE numbness, worse in the RLE from the knee to the foot Focal weakness or buckling: Yes, with pain patients R knee feels like it is going to buckle Aggravating factors: Walking Relieving factors: Pain is very brief 24-hour pain behavior: Randomly occurs with walking History of prior back, hip, or knee injury, pain, surgery, or therapy: Yes, history of chronic lumbar stenosis, prior history of R knee pain which resolved after steroid injection Falls: Has patient fallen in last 6 months? No Dominant hand: right Imaging: Yes, 04/21/21: 4 views of the right knee (weightbearing AP, Lutricia Feil, merchant, lateral) were obtained.  There are no fractures or dislocations noted. There are mild tricompartmental degenerative changes with small osteophyte formation about the medial and lateral compartments as more prominent osteophyte formation about the patella.  Joint space appears to be relatively preserved on the medial side, but moderately narrowed on the lateral side. There does appear to  be a joint effusion present.  Prior level of function: Independent Occupational demands: He is a professor Doctor, general practice at [employer]. Hobbies: Walking, working in the yard; Red flags: Negative for chills/fever, night sweats, nausea, vomiting, unrelenting pain  Precautions: None  Weight Bearing Restrictions: No  Living Environment Lives with: lives with their spouse Lives in: House/apartment, pt denies any problems navigating house  Patient Goals: Strengthen RLE to prevent pain from recurring   OBJECTIVE:   Cognition Patient is oriented to person, place, and time.  Recent memory is intact.  Remote memory is intact.  Attention span and concentration are intact.  Expressive speech is intact.  Patient's fund of knowledge is within normal limits for educational level.    Gross Musculoskeletal Assessment Tremor: None Bulk: Normal Tone: Normal Swelling noted around R knee joint inferior to patella on both medial and lateral sides;  GAIT: No gross deficits noted in gait when ambulating in clinic however full gait assessment deferred  Posture: Deferred  AROM  AROM (Normal range in degrees) AROM  06/02/2021  Lumbar   Flexion (65)   Extension (30)   Right lateral flexion (25)   Left lateral flexion (25)   Right rotation (30)   Left rotation (30)       Hip Right Left  Flexion (125) WNL   Extension (15)    Abduction (40) WNL   Adduction  WNL   Internal Rotation (45) WNL   External Rotation (45) WNL       Knee    Flexion (135) Full, pain with overpressure   Extension (0) Full       Ankle    Dorsiflexion (20)    Plantarflexion (50)    Inversion (35)    Eversion (15    (* = pain; Blank rows = not tested)   LE MMT:  MMT (out of 5) Right 06/02/2021 Left 06/02/2021  Hip flexion 5 5  Hip extension 4 4  Hip abduction 4 4-  Hip adduction 4+ 4  Hip internal rotation 5 5  Hip external rotation 5 5  Knee flexion 4 4  Knee extension 5 5  Ankle  dorsiflexion 5 5  Ankle plantarflexion    Ankle inversion  Ankle eversion    (* = pain; Blank rows = not tested)   Sensation Grossly intact to light touch bilateral LEs as determined by testing dermatomes L2-S2. Proprioception, and hot/cold testing deferred on this date.   Reflexes Deferred  Muscle Length Hamstrings: R: Positive for restriction at 75 degrees L: Positive for restrction at 80 degrees Quadriceps Pat Patrick): R: Not examined L: Not examined Hip flexors Marcello Moores): R: Not examined L: Not examined IT band Nicoletta Dress): R: Negative L: Not examined   Palpation  Location LEFT  RIGHT           Quadriceps  0  Medial Hamstrings  0  Lateral Hamstrings  0  Lateral Hamstring tendon  0  Medial Hamstring tendon  0  Quadriceps tendon  0  Patella  0  Patellar Tendon  0  Tibial Tuberosity  0  Medial joint line  1  Lateral joint line  0  MCL  0  LCL  0  Adductor Tubercle    Pes Anserine tendon    Infrapatellar fat pad  0  Fibular head  0  Popliteal fossa  0  (Blank rows = not tested) Graded on 0-4 scale (0 = no pain, 1 = pain, 2 = pain with wincing/grimacing/flinching, 3 = pain with withdrawal, 4 = unwilling to allow palpation), (Blank rows = not tested)   Passive Accessory Motion Tibiofemoral: Distraction: R: Negative L: Not examined Anterior Glide: R: Negative L: Not examined Posterior Glide: R: Negative L: Negative Medial Glide: R: Not examined L: Not examined Lateral Glide: R: Not examined L: Not examined  Fibula on Femur: Anterior: R: Negative L: Not examined Posterior: R: Negative L: Not examined  Patellofemoral: Superior Glide: R: Negative L: Not examined Inferior Glide: R: Negative L: Not examined Medial Glide: R: Negative L: Not examined Lateral Glide: R: Negative L: Not examined  Medial Tilt: R: Negative L: Not examined Lateral Tilt: R: Negative L: Not examined   VASCULAR Deferred   SPECIAL TESTS  Ligamentous Stability  ACL: Lachman's: R: Negative  L: Negative Active Lachman's: R: Not examined L: Not examined Anterior Drawer: R: Negative L: Negative Pivot Shift: R: Negative L: Not examined  PCL: Posterior Drawer: R: Negative L: Negative Reverse Lachman's: R: Negative L: Negative Posterior Sag Sign: R: Negative L: Negative  MCL: Valgus Stress (30 degrees flexion): R: Negative L: Not examined  LCL: Varus Stress (30 degrees flexion): R: Negative L: Not examined  Meniscus Tests McMurray's Test:  Medial Meniscus (Tibial ER): R: Negative L: Not examined Lateral Meniscus (Tibial IR): R: Negative L: Not examined   Patellofemoral Pain Syndrome Patellar Tilt (Lateral): R: Negative L: Not examined Patellar Apprehension: R: Not examined L: Not examined Squatting pain: R: Not examined L: Not examined Stair climbing pain: R: Negative L: Not examined Kneeling pain: R: Negative L: Not examined Resisted knee extension pain: R: Negative L: Negative Compression: R: Positive L: Not examined Clarke's sign: R: Negative L: Not examined Lateral Pull: R: Not examined L: Not examined   Patellar Tendinopathy Inferior pole palpation with anterior tilt: R: Not examined L: Not examined    Beighton Scale: Deferred   [place] Knee Rules for Acute Knee injury  Yes/No         1. Aged 55 years or over Yes   2. Tenderness at the head of the fibula No   3. Isolated tenderness of the patella  No   4. Inability to flex knee to 90 degrees No   5. Inability to  bear weight (defined as an inability to take 4 steps, ie two steps on each leg, regardless of limping) immediately and at presentation No      TODAY'S TREATMENT  Standing R Hip Abduction with red tband resistance at ankles and BUE support 2 x 10; Standing R Hip Extension with red tband resistance at ankles and BUE support 2 x 10; Heel Raises with BUE Support  2 x 30; Mini Squat with BUE Support 2 x 10 HEP issued and reviewed with patient   PATIENT EDUCATION:  Education details: Plan of  care and HEP Person educated: Patient Education method: Explanation and handout Education comprehension: verbalized understanding and returned demonstration   HOME EXERCISE PROGRAM: Access Code: [email or web address] URL: [email or web address] Date: 06/01/2021 Prepared by: Eden Roc with Counter Support  - 1 x daily - 7 x weekly - 2 sets - 30 reps - 2s hold - Mini Squat with Counter Support  - 1 x daily - 7 x weekly - 2 sets - 10 reps - Standing Hip Abduction with Resistance at Ankles and Counter Support  - 1 x daily - 7 x weekly - 2 sets - 10 reps - 2s hold - Standing Hip Extension with Resistance at [employer] (Mirrored)  - 1 x daily - 7 x weekly - 2 sets - 10 reps - 2s hold   ASSESSMENT:  CLINICAL IMPRESSION: Patient is a 88 y.o. male who was seen today for physical therapy evaluation and treatment for R knee pain which has since resolved since receiving a steroid injection. Objective impairments include decreased strength in bilateral hip abduction, adduction, and extension as well as R hamstring weakness. Pain with overpressure into R knee flexion as well as mild tenderness to medial joint line palpation. Positive pain with patellar compression. These impairments are not limiting patient's function at this time however were previously causing intermittent pain with walking. Personal factors including Age, Past/current experiences, and Time since onset of injury/illness/exacerbation are also affecting patient's functional outcome. Patient will benefit from skilled PT to address above impairments and improve overall function.  REHAB POTENTIAL: Excellent  CLINICAL DECISION MAKING: Stable/uncomplicated  EVALUATION COMPLEXITY: Low   GOALS: Goals reviewed with patient? Yes  SHORT TERM GOALS: Target date: 06/22/2021   Pt will be independent with HEP to improve strength and decrease knee pain to improve pain-free function at home and work. Baseline:   Goal status: INITIAL   LONG TERM GOALS: Target date: 07/13/21  Pt will increase FOTO to predicted improvement to demonstrate significant improvement in function at home and work related to knee pain  Baseline: 06/01/21: To be completed at next visit Goal status: INITIAL  2.  Pt will report no further R knee pain in order to improve his ability to walk without any significant increase in pain.  Baseline: 06/01/21: None at time of evaluation. Initially he was having 10/10 when sharp pain would occur. Goal status: INITIAL  3.  Pt will increase strength of R hip abduction and extension by at least 1/2 MMT grade in order to demonstrate improvement in strength and function  Baseline: 06/01/21: 4/5 for both abduction and extension Goal status: INITIAL   PLAN: PT FREQUENCY: 1-2x/week  PT DURATION: 8 weeks  PLANNED INTERVENTIONS: Therapeutic exercises, Therapeutic activity, Neuromuscular re-education, Balance training, Gait training, Patient/Family education, Joint manipulation, Joint mobilization, Canalith repositioning, Aquatic Therapy, Dry Needling, Electrical stimulation, Spinal manipulation, Spinal mobilization, Cryotherapy, Moist heat, Taping, Traction, Ultrasound, Ionotophoresis '4mg'$ /ml Dexamethasone, and Manual therapy  PLAN FOR NEXT SESSION: Pt to complete FOTO; progress R hip, knee, and calf as well as core strengthening;  Lyndel Safe Darshana Curnutt PT, DPT, GCS  Shaheen Star 06/02/2021, 1:37 PM

## 2021-06-01 ENCOUNTER — Ambulatory Visit: Payer: Medicare Other | Attending: Orthopedic Surgery

## 2021-06-01 DIAGNOSIS — G8929 Other chronic pain: Secondary | ICD-10-CM | POA: Diagnosis present

## 2021-06-01 DIAGNOSIS — M25561 Pain in right knee: Secondary | ICD-10-CM | POA: Insufficient documentation

## 2021-06-08 ENCOUNTER — Ambulatory Visit: Payer: Medicare Other

## 2021-06-08 DIAGNOSIS — G8929 Other chronic pain: Secondary | ICD-10-CM

## 2021-06-08 DIAGNOSIS — M25561 Pain in right knee: Secondary | ICD-10-CM | POA: Diagnosis not present

## 2021-06-08 NOTE — Therapy (Signed)
OUTPATIENT PHYSICAL THERAPY KNEE TREATMENT   Patient Name: Patrick Wu MRN: [id number] DOB:Dec 23, 1933, 88 y.o., male Today's Date: 06/08/2021   PT End of Session - 06/08/21 1144     Visit Number 2    Number of Visits 7    Date for PT Re-Evaluation 07/13/21    Authorization Type eval: 06/01/21    PT Start Time 1145    PT Stop Time 1230    PT Time Calculation (min) 45 min    Activity Tolerance Patient tolerated treatment well    Behavior During Therapy [facility] for tasks assessed/performed              Past Medical History:  Diagnosis Date   Asthma    Basal cell carcinoma 03/26/2019   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   PCP: Assunta Gambles, MD  REFERRING PROVIDER: Assunta Gambles, MD  REFERRING DIAGNOSIS: M25.561 (ICD-10-CM) - Pain in right knee  THERAPY DIAG: Chronic pain of right knee  RATIONALE FOR EVALUATION AND TREATMENT: Rehabilitation  ONSET DATE: 03/10/21 (approximate)  FOLLOW UP APPT WITH PROVIDER: No, pt advised to call as needed  FROM INITIAL EVALUATION  SUBJECTIVE:                                                                                                                                                                                         Chief Complaint: R knee pain  Pertinent History Patrick Wu is a 88 y.o. male self-referred to Dr. Leim Fabry with [facility] due to R knee pain. Symptoms initially began 7 years prior. At that time, he was evaluated at [facility] by Dr. Marlou Starks. An MRI from 2016 showed a horizontal tear of the posterior horn of the medial meniscus, a horizontal tear of the anterior horn/body junction of the lateral meniscus, and early tricompartmenal degenerative changes of the knee, most prominently affecting the patellofemoral  compartment. He had a corticosteroid injection with resolution of his pain until around the beginning of March 2023 when his symptoms returned. At the time his symptoms returned he reports very intermittent and sudden onset of R knee pain when walking. Symptoms would almost immediately resolve. He saw Dr. Posey Pronto who injected his R knee with 1 mL of Kenalog '40mg'$ , 2 mL of 1% Lidocaine, and 2 mL of 0.5% Ropivicaine without complication. He was referred for physical therapy to work on core, hip, and knee strengthening exercises. He is a professor  Doctor, general practice at [employer].  Pain:  Pain Intensity: Present: 0/10, Best: 0/10, Worst: 10/10 (none recently) Pain location: R knee joint, pt has difficulty localizing pain when it occurs because it is brief Pain quality: sharp  Radiating pain: No  Swelling: Yes, chronic ongoing swelling in R knee Popping, catching, locking: Yes, momentary catch but no locking when pain occurs Numbness/Tingling: Yes, chronic ongoing BLE numbness, worse in the RLE from the knee to the foot Focal weakness or buckling: Yes, with pain patients R knee feels like it is going to buckle Aggravating factors: Walking Relieving factors: Pain is very brief 24-hour pain behavior: Randomly occurs with walking History of prior back, hip, or knee injury, pain, surgery, or therapy: Yes, history of chronic lumbar stenosis, prior history of R knee pain which resolved after steroid injection Falls: Has patient fallen in last 6 months? No Dominant hand: right Imaging: Yes, 04/21/21: 4 views of the right knee (weightbearing AP, Lutricia Feil, merchant, lateral) were obtained.  There are no fractures or dislocations noted. There are mild tricompartmental degenerative changes with small osteophyte formation about the medial and lateral compartments as more prominent osteophyte formation about the patella.  Joint space appears to be relatively preserved on the medial side, but moderately narrowed on the  lateral side. There does appear to be a joint effusion present.  Prior level of function: Independent Occupational demands: He is a professor Doctor, general practice at [employer]. Hobbies: Walking, working in the yard; Red flags: Negative for chills/fever, night sweats, nausea, vomiting, unrelenting pain  Precautions: None  Weight Bearing Restrictions: No  Living Environment Lives with: lives with their spouse Lives in: House/apartment, pt denies any problems navigating house  Patient Goals: Strengthen RLE to prevent pain from recurring   OBJECTIVE:   Cognition Patient is oriented to person, place, and time.  Recent memory is intact.  Remote memory is intact.  Attention span and concentration are intact.  Expressive speech is intact.  Patient's fund of knowledge is within normal limits for educational level.    Gross Musculoskeletal Assessment Tremor: None Bulk: Normal Tone: Normal Swelling noted around R knee joint inferior to patella on both medial and lateral sides;  GAIT: No gross deficits noted in gait when ambulating in clinic however full gait assessment deferred  Posture: Deferred  AROM  AROM (Normal range in degrees) AROM  06/08/2021  Lumbar   Flexion (65)   Extension (30)   Right lateral flexion (25)   Left lateral flexion (25)   Right rotation (30)   Left rotation (30)       Hip Right Left  Flexion (125) WNL   Extension (15)    Abduction (40) WNL   Adduction  WNL   Internal Rotation (45) WNL   External Rotation (45) WNL       Knee    Flexion (135) Full, pain with overpressure   Extension (0) Full       Ankle    Dorsiflexion (20)    Plantarflexion (50)    Inversion (35)    Eversion (15    (* = pain; Blank rows = not tested)   LE MMT:  MMT (out of 5) Right 06/08/2021 Left 06/08/2021  Hip flexion 5 5  Hip extension 4 4  Hip abduction 4 4-  Hip adduction 4+ 4  Hip internal rotation 5 5  Hip external rotation 5 5  Knee flexion 4 4   Knee extension 5 5  Ankle dorsiflexion 5 5  Ankle  plantarflexion    Ankle inversion    Ankle eversion    (* = pain; Blank rows = not tested)   Sensation Grossly intact to light touch bilateral LEs as determined by testing dermatomes L2-S2. Proprioception, and hot/cold testing deferred on this date.   Reflexes Deferred  Muscle Length Hamstrings: R: Positive for restriction at 75 degrees L: Positive for restrction at 80 degrees Quadriceps Pat Patrick): R: Not examined L: Not examined Hip flexors Marcello Moores): R: Not examined L: Not examined IT band Nicoletta Dress): R: Negative L: Not examined   Palpation  Location LEFT  RIGHT           Quadriceps  0  Medial Hamstrings  0  Lateral Hamstrings  0  Lateral Hamstring tendon  0  Medial Hamstring tendon  0  Quadriceps tendon  0  Patella  0  Patellar Tendon  0  Tibial Tuberosity  0  Medial joint line  1  Lateral joint line  0  MCL  0  LCL  0  Adductor Tubercle    Pes Anserine tendon    Infrapatellar fat pad  0  Fibular head  0  Popliteal fossa  0  (Blank rows = not tested) Graded on 0-4 scale (0 = no pain, 1 = pain, 2 = pain with wincing/grimacing/flinching, 3 = pain with withdrawal, 4 = unwilling to allow palpation), (Blank rows = not tested)   Passive Accessory Motion Tibiofemoral: Distraction: R: Negative L: Not examined Anterior Glide: R: Negative L: Not examined Posterior Glide: R: Negative L: Negative Medial Glide: R: Not examined L: Not examined Lateral Glide: R: Not examined L: Not examined  Fibula on Femur: Anterior: R: Negative L: Not examined Posterior: R: Negative L: Not examined  Patellofemoral: Superior Glide: R: Negative L: Not examined Inferior Glide: R: Negative L: Not examined Medial Glide: R: Negative L: Not examined Lateral Glide: R: Negative L: Not examined  Medial Tilt: R: Negative L: Not examined Lateral Tilt: R: Negative L: Not examined   VASCULAR Deferred   SPECIAL TESTS  Ligamentous Stability   ACL: Lachman's: R: Negative L: Negative Active Lachman's: R: Not examined L: Not examined Anterior Drawer: R: Negative L: Negative Pivot Shift: R: Negative L: Not examined  PCL: Posterior Drawer: R: Negative L: Negative Reverse Lachman's: R: Negative L: Negative Posterior Sag Sign: R: Negative L: Negative  MCL: Valgus Stress (30 degrees flexion): R: Negative L: Not examined  LCL: Varus Stress (30 degrees flexion): R: Negative L: Not examined  Meniscus Tests McMurray's Test:  Medial Meniscus (Tibial ER): R: Negative L: Not examined Lateral Meniscus (Tibial IR): R: Negative L: Not examined   Patellofemoral Pain Syndrome Patellar Tilt (Lateral): R: Negative L: Not examined Patellar Apprehension: R: Not examined L: Not examined Squatting pain: R: Not examined L: Not examined Stair climbing pain: R: Negative L: Not examined Kneeling pain: R: Negative L: Not examined Resisted knee extension pain: R: Negative L: Negative Compression: R: Positive L: Not examined Clarke's sign: R: Negative L: Not examined Lateral Pull: R: Not examined L: Not examined   Patellar Tendinopathy Inferior pole palpation with anterior tilt: R: Not examined L: Not examined    Beighton Scale: Deferred   [place] Knee Rules for Acute Knee injury  Yes/No         1. Aged 55 years or over Yes   2. Tenderness at the head of the fibula No   3. Isolated tenderness of the patella  No   4. Inability to flex knee  to 90 degrees No   5. Inability to bear weight (defined as an inability to take 4 steps, ie two steps on each leg, regardless of limping) immediately and at presentation No      TODAY'S TREATMENT   SUBJECTIVE: Pt reports that he is doing well today. No significant changes since the initial evaluation. Denies knee pain upon arrival. Reports that he had one instance of knee pain when performing mini squats however additional sets were pain-free. No specific questions or concerns currently.   PAIN:  Denies  Ther-ex  NuStep L1-2 BLE only (seat 10) x 5 minutes for warm-up and strengthening during interval history; Total Gym L22 Mini Squats 2 x 15; 6" step lateral heel taps 2 x 15 BLE;  Standing hip exercises with 5# ankle weights (AW) BLE: Hip flexion marches x 15; Hip abduction x 15; Hamstring curls x 15; Hip extension x 15;  Seated LAQ with 5# AW x 15 BLE; Forward BOSU (round side up) lunges without UE support x 15 BLE; Lateral BOSU (round side up) lunges without UE support x 15 toward each side; R single leg heel raises with BUE support for stability 2 x 20; Resisted side stepping in // bars with green tband around ankles x 6 lengths; Airex tandem stance alternating forward LE with ball tosses to challenge dynamic knee stability x 45s on each; R single leg balance assessment; HEP updated and reviewed with patient   PATIENT EDUCATION:  Education details: Updated HEP Person educated: Patient Education method: Explanation, demonstration, and handout Education comprehension: verbalized understanding and returned demonstration   HOME EXERCISE PROGRAM: Access Code: [email or web address] URL: [email or web address] Date: 06/08/2021 Prepared by: Roxana Hires  Exercises - Single Leg Heel Raise with Counter Support (Mirrored)  - 1 x daily - 7 x weekly - 2 sets - 20 reps - Mini Squat with Counter Support  - 1 x daily - 7 x weekly - 2 sets - 10 reps - Standing Hip Abduction with Resistance at Ankles and Counter Support  - 1 x daily - 7 x weekly - 2 sets - 10 reps - 2s hold - Standing Hip Extension with Resistance at [employer] (Mirrored)  - 1 x daily - 7 x weekly - 2 sets - 10 reps - 2s hold - Single Leg Stance (Mirrored)  - 1 x daily - 7 x weekly - 3 reps - 30s hold   ASSESSMENT:  CLINICAL IMPRESSION: Patient demonstrates excellent effort during session. He denies any R knee pain during all exercises today. Progressed BLE strengthening incorporating dynamic stability  as well on Airex pad. HEP updated with patient to include additional exercises including single leg stance to improve RLE stability. Patient will benefit from skilled PT to address above impairments and improve overall function.  REHAB POTENTIAL: Excellent  CLINICAL DECISION MAKING: Stable/uncomplicated  EVALUATION COMPLEXITY: Low   GOALS: Goals reviewed with patient? Yes  SHORT TERM GOALS: Target date: 06/22/2021   Pt will be independent with HEP to improve strength and decrease knee pain to improve pain-free function at home and work. Baseline:  Goal status: INITIAL   LONG TERM GOALS: Target date: 07/13/21  Pt will increase FOTO to predicted improvement to demonstrate significant improvement in function at home and work related to knee pain  Baseline: 06/01/21: To be completed at next visit Goal status: 06/08/21: Discontinued secondary to FOTO report  2.  Pt will report no further R knee pain in order to improve his ability to walk without any significant  increase in pain.  Baseline: 06/01/21: None at time of evaluation. Initially he was having 10/10 when sharp pain would occur. Goal status: INITIAL  3.  Pt will increase strength of R hip abduction and extension by at least 1/2 MMT grade in order to demonstrate improvement in strength and function  Baseline: 06/01/21: 4/5 for both abduction and extension Goal status: INITIAL   PLAN: PT FREQUENCY: 1-2x/week  PT DURATION: 8 weeks  PLANNED INTERVENTIONS: Therapeutic exercises, Therapeutic activity, Neuromuscular re-education, Balance training, Gait training, Patient/Family education, Joint manipulation, Joint mobilization, Canalith repositioning, Aquatic Therapy, Dry Needling, Electrical stimulation, Spinal manipulation, Spinal mobilization, Cryotherapy, Moist heat, Taping, Traction, Ultrasound, Ionotophoresis '4mg'$ /ml Dexamethasone, and Manual therapy  PLAN FOR NEXT SESSION: progress R hip, knee, and calf as well as core  strengthening;  Patrick Wu PT, DPT, GCS  Krystian Younglove 06/08/2021, 1:49 PM

## 2021-06-08 NOTE — Therapy (Deleted)
OUTPATIENT PHYSICAL THERAPY KNEE TREATMENT   Patient Name: Patrick Wu MRN: [id number] DOB:1934/01/07, 88 y.o., male Today's Date: 06/08/2021   PT End of Session - 06/08/21 1144     Visit Number 2    Number of Visits 7    Date for PT Re-Evaluation 07/13/21    Authorization Type eval: 06/01/21    PT Start Time 1145    PT Stop Time 1230    PT Time Calculation (min) 45 min    Activity Tolerance Patient tolerated treatment well    Behavior During Therapy [name] for tasks assessed/performed              Past Medical History:  Diagnosis Date   Asthma    Basal cell carcinoma 03/26/2019   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   PCP: Assunta Gambles, MD  REFERRING PROVIDER: Assunta Gambles, MD  REFERRING DIAGNOSIS: M25.561 (ICD-10-CM) - Pain in right knee  THERAPY DIAG: Chronic pain of right knee  RATIONALE FOR EVALUATION AND TREATMENT: Rehabilitation  ONSET DATE: 03/10/21 (approximate)  FOLLOW UP APPT WITH PROVIDER: No, pt advised to call as needed  FROM INITIAL EVALUATION  SUBJECTIVE:                                                                                                                                                                                         Chief Complaint: R knee pain  Pertinent History Patrick Wu is a 88 y.o. male self-referred to Dr. Leim Fabry with [facility] orthopedics due to R knee pain. Symptoms initially began 7 years prior. At that time, he was evaluated at [facility] by Dr. Marlou Starks. An MRI from 2016 showed a horizontal tear of the posterior horn of the medial meniscus, a horizontal tear of the anterior horn/body junction of the lateral meniscus, and early tricompartmenal degenerative changes of the knee, most prominently affecting the patellofemoral  compartment. He had a corticosteroid injection with resolution of his pain until around the beginning of March 2023 when his symptoms returned. At the time his symptoms returned he reports very intermittent and sudden onset of R knee pain when walking. Symptoms would almost immediately resolve. He saw Dr. Posey Pronto who injected his R knee with 1 mL of Kenalog '40mg'$ , 2 mL of 1% Lidocaine, and 2 mL of 0.5% Ropivicaine without complication. He was referred for physical therapy to work on core, hip, and knee strengthening exercises. He is a professor  Doctor, general practice at [employer].  Pain:  Pain Intensity: Present: 0/10, Best: 0/10, Worst: 10/10 (none recently) Pain location: R knee joint, pt has difficulty localizing pain when it occurs because it is brief Pain quality: sharp  Radiating pain: No  Swelling: Yes, chronic ongoing swelling in R knee Popping, catching, locking: Yes, momentary catch but no locking when pain occurs Numbness/Tingling: Yes, chronic ongoing BLE numbness, worse in the RLE from the knee to the foot Focal weakness or buckling: Yes, with pain patients R knee feels like it is going to buckle Aggravating factors: Walking Relieving factors: Pain is very brief 24-hour pain behavior: Randomly occurs with walking History of prior back, hip, or knee injury, pain, surgery, or therapy: Yes, history of chronic lumbar stenosis, prior history of R knee pain which resolved after steroid injection Falls: Has patient fallen in last 6 months? No Dominant hand: right Imaging: Yes, 04/21/21: 4 views of the right knee (weightbearing AP, Lutricia Feil, merchant, lateral) were obtained.  There are no fractures or dislocations noted. There are mild tricompartmental degenerative changes with small osteophyte formation about the medial and lateral compartments as more prominent osteophyte formation about the patella.  Joint space appears to be relatively preserved on the medial side, but moderately narrowed on the  lateral side. There does appear to be a joint effusion present.  Prior level of function: Independent Occupational demands: He is a professor Doctor, general practice at [employer]. Hobbies: Walking, working in the yard; Red flags: Negative for chills/fever, night sweats, nausea, vomiting, unrelenting pain  Precautions: None  Weight Bearing Restrictions: No  Living Environment Lives with: lives with their spouse Lives in: House/apartment, pt denies any problems navigating house  Patient Goals: Strengthen RLE to prevent pain from recurring   OBJECTIVE:   Cognition Patient is oriented to person, place, and time.  Recent memory is intact.  Remote memory is intact.  Attention span and concentration are intact.  Expressive speech is intact.  Patient's fund of Patrick is within normal limits for educational level.    Gross Musculoskeletal Assessment Tremor: None Bulk: Normal Tone: Normal Swelling noted around R knee joint inferior to patella on both medial and lateral sides;  GAIT: No gross deficits noted in gait when ambulating in clinic however full gait assessment deferred  Posture: Deferred  AROM  AROM (Normal range in degrees) AROM  06/08/2021  Lumbar   Flexion (65)   Extension (30)   Right lateral flexion (25)   Left lateral flexion (25)   Right rotation (30)   Left rotation (30)       Hip Right Left  Flexion (125) WNL   Extension (15)    Abduction (40) WNL   Adduction  WNL   Internal Rotation (45) WNL   External Rotation (45) WNL       Knee    Flexion (135) Full, pain with overpressure   Extension (0) Full       Ankle    Dorsiflexion (20)    Plantarflexion (50)    Inversion (35)    Eversion (15    (* = pain; Blank rows = not tested)   LE MMT:  MMT (out of 5) Right 06/08/2021 Left 06/08/2021  Hip flexion 5 5  Hip extension 4 4  Hip abduction 4 4-  Hip adduction 4+ 4  Hip internal rotation 5 5  Hip external rotation 5 5  Knee flexion 4 4   Knee extension 5 5  Ankle dorsiflexion 5 5  Ankle  plantarflexion    Ankle inversion    Ankle eversion    (* = pain; Blank rows = not tested)   Sensation Grossly intact to light touch bilateral LEs as determined by testing dermatomes L2-S2. Proprioception, and hot/cold testing deferred on this date.   Reflexes Deferred  Muscle Length Hamstrings: R: Positive for restriction at 75 degrees L: Positive for restrction at 80 degrees Quadriceps Pat Patrick): R: Not examined L: Not examined Hip flexors Marcello Moores): R: Not examined L: Not examined IT band Nicoletta Dress): R: Negative L: Not examined   Palpation  Location LEFT  RIGHT           Quadriceps  0  Medial Hamstrings  0  Lateral Hamstrings  0  Lateral Hamstring tendon  0  Medial Hamstring tendon  0  Quadriceps tendon  0  Patella  0  Patellar Tendon  0  Tibial Tuberosity  0  Medial joint line  1  Lateral joint line  0  MCL  0  LCL  0  Adductor Tubercle    Pes Anserine tendon    Infrapatellar fat pad  0  Fibular head  0  Popliteal fossa  0  (Blank rows = not tested) Graded on 0-4 scale (0 = no pain, 1 = pain, 2 = pain with wincing/grimacing/flinching, 3 = pain with withdrawal, 4 = unwilling to allow palpation), (Blank rows = not tested)   Passive Accessory Motion Tibiofemoral: Distraction: R: Negative L: Not examined Anterior Glide: R: Negative L: Not examined Posterior Glide: R: Negative L: Negative Medial Glide: R: Not examined L: Not examined Lateral Glide: R: Not examined L: Not examined  Fibula on Femur: Anterior: R: Negative L: Not examined Posterior: R: Negative L: Not examined  Patellofemoral: Superior Glide: R: Negative L: Not examined Inferior Glide: R: Negative L: Not examined Medial Glide: R: Negative L: Not examined Lateral Glide: R: Negative L: Not examined  Medial Tilt: R: Negative L: Not examined Lateral Tilt: R: Negative L: Not examined   VASCULAR Deferred   SPECIAL TESTS  Ligamentous Stability   ACL: Lachman's: R: Negative L: Negative Active Lachman's: R: Not examined L: Not examined Anterior Drawer: R: Negative L: Negative Pivot Shift: R: Negative L: Not examined  PCL: Posterior Drawer: R: Negative L: Negative Reverse Lachman's: R: Negative L: Negative Posterior Sag Sign: R: Negative L: Negative  MCL: Valgus Stress (30 degrees flexion): R: Negative L: Not examined  LCL: Varus Stress (30 degrees flexion): R: Negative L: Not examined  Meniscus Tests McMurray's Test:  Medial Meniscus (Tibial ER): R: Negative L: Not examined Lateral Meniscus (Tibial IR): R: Negative L: Not examined   Patellofemoral Pain Syndrome Patellar Tilt (Lateral): R: Negative L: Not examined Patellar Apprehension: R: Not examined L: Not examined Squatting pain: R: Not examined L: Not examined Stair climbing pain: R: Negative L: Not examined Kneeling pain: R: Negative L: Not examined Resisted knee extension pain: R: Negative L: Negative Compression: R: Positive L: Not examined Clarke's sign: R: Negative L: Not examined Lateral Pull: R: Not examined L: Not examined   Patellar Tendinopathy Inferior pole palpation with anterior tilt: R: Not examined L: Not examined    Beighton Scale: Deferred   [place] Knee Rules for Acute Knee injury  Yes/No         1. Aged 55 years or over Yes   2. Tenderness at the head of the fibula No   3. Isolated tenderness of the patella  No   4. Inability to flex knee  to 90 degrees No   5. Inability to bear weight (defined as an inability to take 4 steps, ie two steps on each leg, regardless of limping) immediately and at presentation No      TODAY'S TREATMENT   SUBJECTIVE  PAIN Pt to complete FOTO; progress R hip, knee, and calf as well as core strengthening;  Ther-ex  Standing R Hip Abduction with red tband resistance at ankles and BUE support 2 x 10; Standing R Hip Extension with red tband resistance at ankles and BUE support 2 x 10; Heel Raises with  BUE Support  2 x 30; Mini Squat with BUE Support 2 x 10 HEP issued and reviewed with patient   PATIENT EDUCATION:  Education details: Plan of care and HEP Person educated: Patient Education method: Explanation and handout Education comprehension: verbalized understanding and returned demonstration   HOME EXERCISE PROGRAM: Access Code: [email or web address] URL: [email or web address] Date: 06/01/2021 Prepared by: [place] with Counter Support  - 1 x daily - 7 x weekly - 2 sets - 30 reps - 2s hold - Mini Squat with Counter Support  - 1 x daily - 7 x weekly - 2 sets - 10 reps - Standing Hip Abduction with Resistance at Ankles and Counter Support  - 1 x daily - 7 x weekly - 2 sets - 10 reps - 2s hold - Standing Hip Extension with Resistance at [employer] (Mirrored)  - 1 x daily - 7 x weekly - 2 sets - 10 reps - 2s hold   ASSESSMENT:  CLINICAL IMPRESSION: Patient is a 88 y.o. male who was seen today for physical therapy evaluation and treatment for R knee pain which has since resolved since receiving a steroid injection. Objective impairments include decreased strength in bilateral hip abduction, adduction, and extension as well as R hamstring weakness. Pain with overpressure into R knee flexion as well as mild tenderness to medial joint line palpation. Positive pain with patellar compression. These impairments are not limiting patient's function at this time however were previously causing intermittent pain with walking. Personal factors including Age, Past/current experiences, and Time since onset of injury/illness/exacerbation are also affecting patient's functional outcome. Patient will benefit from skilled PT to address above impairments and improve overall function.  REHAB POTENTIAL: Excellent  CLINICAL DECISION MAKING: Stable/uncomplicated  EVALUATION COMPLEXITY: Low   GOALS: Goals reviewed with patient? Yes  SHORT TERM GOALS: Target date:  06/22/2021   Pt will be independent with HEP to improve strength and decrease knee pain to improve pain-free function at home and work. Baseline:  Goal status: INITIAL   LONG TERM GOALS: Target date: 07/13/21  Pt will increase FOTO to predicted improvement to demonstrate significant improvement in function at home and work related to knee pain  Baseline: 06/01/21: To be completed at next visit Goal status: INITIAL  2.  Pt will report no further R knee pain in order to improve his ability to walk without any significant increase in pain.  Baseline: 06/01/21: None at time of evaluation. Initially he was having 10/10 when sharp pain would occur. Goal status: INITIAL  3.  Pt will increase strength of R hip abduction and extension by at least 1/2 MMT grade in order to demonstrate improvement in strength and function  Baseline: 06/01/21: 4/5 for both abduction and extension Goal status: INITIAL   PLAN: PT FREQUENCY: 1-2x/week  PT DURATION: 8 weeks  PLANNED INTERVENTIONS: Therapeutic exercises, Therapeutic activity, Neuromuscular re-education, Balance training, Gait training,  Patient/Family education, Joint manipulation, Joint mobilization, Canalith repositioning, Aquatic Therapy, Dry Needling, Electrical stimulation, Spinal manipulation, Spinal mobilization, Cryotherapy, Moist heat, Taping, Traction, Ultrasound, Ionotophoresis '4mg'$ /ml Dexamethasone, and Manual therapy  PLAN FOR NEXT SESSION: Pt to complete FOTO; progress R hip, knee, and calf as well as core strengthening;  Lyndel Safe Luvia Orzechowski PT, DPT, GCS  Mieko Kneebone 06/08/2021, 11:45 AM

## 2021-06-14 ENCOUNTER — Ambulatory Visit: Payer: Medicare Other | Attending: Orthopedic Surgery

## 2021-06-14 DIAGNOSIS — M25561 Pain in right knee: Secondary | ICD-10-CM | POA: Insufficient documentation

## 2021-06-14 DIAGNOSIS — G8929 Other chronic pain: Secondary | ICD-10-CM | POA: Insufficient documentation

## 2021-06-14 NOTE — Therapy (Signed)
OUTPATIENT PHYSICAL THERAPY KNEE TREATMENT   Patient Name: Patrick Wu MRN: [id number] DOB:23-Apr-1933, 88 y.o., male Today's Date: 06/14/2021   PT End of Session - 06/14/21 1108     Visit Number 3    Number of Visits 7    Date for PT Re-Evaluation 07/13/21    Authorization Type eval: 06/01/21    PT Start Time 1101    PT Stop Time 1145    PT Time Calculation (min) 44 min    Activity Tolerance Patient tolerated treatment well    Behavior During Therapy [facility] for tasks assessed/performed               Past Medical History:  Diagnosis Date   Asthma    Basal cell carcinoma 03/26/2019   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   PCP: Assunta Gambles, MD  REFERRING PROVIDER: Assunta Gambles, MD  REFERRING DIAGNOSIS: M25.561 (ICD-10-CM) - Pain in right knee  THERAPY DIAG: Chronic pain of right knee  RATIONALE FOR EVALUATION AND TREATMENT: Rehabilitation  ONSET DATE: 03/10/21 (approximate)  FOLLOW UP APPT WITH PROVIDER: No, pt advised to call as needed  FROM INITIAL EVALUATION  SUBJECTIVE:                                                                                                                                                                                         Chief Complaint: R knee pain  Pertinent History Patrick Wu is a 88 y.o. male self-referred to Dr. Leim Fabry with [facility] orth[facility]ics due to R knee pain. Symptoms initially began 7 years prior. At that time, he was evaluated at [facility] by Dr. Marlou Starks. An MRI from 2016 showed a horizontal tear of the posterior horn of the medial meniscus, a horizontal tear of the anterior horn/body junction of the lateral meniscus, and early tricompartmenal degenerative changes of the knee, most prominently affecting the patellofemoral  compartment. He had a corticosteroid injection with resolution of his pain until around the beginning of March 2023 when his symptoms returned. At the time his symptoms returned he reports very intermittent and sudden onset of R knee pain when walking. Symptoms would almost immediately resolve. He saw Dr. Posey Pronto who injected his R knee with 1 mL of Kenalog '40mg'$ , 2 mL of 1% Lidocaine, and 2 mL of 0.5% Ropivicaine without complication. He was referred for physical therapy to work on core, hip, and knee strengthening exercises. He is a  professor Doctor, general practice at [employer].  Pain:  Pain Intensity: Present: 0/10, Best: 0/10, Worst: 10/10 (none recently) Pain location: R knee joint, pt has difficulty localizing pain when it occurs because it is brief Pain quality: sharp  Radiating pain: No  Swelling: Yes, chronic ongoing swelling in R knee Popping, catching, locking: Yes, momentary catch but no locking when pain occurs Numbness/Tingling: Yes, chronic ongoing BLE numbness, worse in the RLE from the knee to the foot Focal weakness or buckling: Yes, with pain patients R knee feels like it is going to buckle Aggravating factors: Walking Relieving factors: Pain is very brief 24-hour pain behavior: Randomly occurs with walking History of prior back, hip, or knee injury, pain, surgery, or therapy: Yes, history of chronic lumbar stenosis, prior history of R knee pain which resolved after steroid injection Falls: Has patient fallen in last 6 months? No Dominant hand: right Imaging: Yes, 04/21/21: 4 views of the right knee (weightbearing AP, Lutricia Feil, merchant, lateral) were obtained.  There are no fractures or dislocations noted. There are mild tricompartmental degenerative changes with small osteophyte formation about the medial and lateral compartments as more prominent osteophyte formation about the patella.  Joint space appears to be relatively preserved on the medial side, but moderately narrowed on the  lateral side. There does appear to be a joint effusion present.  Prior level of function: Independent Occupational demands: He is a professor Doctor, general practice at [employer]. Hobbies: Walking, working in the yard; Red flags: Negative for chills/fever, night sweats, nausea, vomiting, unrelenting pain  Precautions: None  Weight Bearing Restrictions: No  Living Environment Lives with: lives with their spouse Lives in: House/apartment, pt denies any problems navigating house  Patient Goals: Strengthen RLE to prevent pain from recurring   OBJECTIVE:   Cognition Patient is oriented to person, place, and time.  Recent memory is intact.  Remote memory is intact.  Attention span and concentration are intact.  Expressive speech is intact.  Patient's fund of knowledge is within normal limits for educational level.    Gross Musculoskeletal Assessment Tremor: None Bulk: Normal Tone: Normal Swelling noted around R knee joint inferior to patella on both medial and lateral sides;  GAIT: No gross deficits noted in gait when ambulating in clinic however full gait assessment deferred  Posture: Deferred  AROM  AROM (Normal range in degrees) AROM  06/14/2021  Lumbar   Flexion (65)   Extension (30)   Right lateral flexion (25)   Left lateral flexion (25)   Right rotation (30)   Left rotation (30)       Hip Right Left  Flexion (125) WNL   Extension (15)    Abduction (40) WNL   Adduction  WNL   Internal Rotation (45) WNL   External Rotation (45) WNL       Knee    Flexion (135) Full, pain with overpressure   Extension (0) Full       Ankle    Dorsiflexion (20)    Plantarflexion (50)    Inversion (35)    Eversion (15    (* = pain; Blank rows = not tested)   LE MMT:  MMT (out of 5) Right 06/14/2021 Left 06/14/2021  Hip flexion 5 5  Hip extension 4 4  Hip abduction 4 4-  Hip adduction 4+ 4  Hip internal rotation 5 5  Hip external rotation 5 5  Knee flexion 4 4  Knee  extension 5 5  Ankle dorsiflexion 5 5  Ankle plantarflexion    Ankle inversion    Ankle eversion    (* = pain; Blank rows = not tested)   Sensation Grossly intact to light touch bilateral LEs as determined by testing dermatomes L2-S2. Proprioception, and hot/cold testing deferred on this date.   Reflexes Deferred  Muscle Length Hamstrings: R: Positive for restriction at 75 degrees L: Positive for restrction at 80 degrees Quadriceps Pat Patrick): R: Not examined L: Not examined Hip flexors Marcello Moores): R: Not examined L: Not examined IT band Nicoletta Dress): R: Negative L: Not examined   Palpation  Location LEFT  RIGHT           Quadriceps  0  Medial Hamstrings  0  Lateral Hamstrings  0  Lateral Hamstring tendon  0  Medial Hamstring tendon  0  Quadriceps tendon  0  Patella  0  Patellar Tendon  0  Tibial Tuberosity  0  Medial joint line  1  Lateral joint line  0  MCL  0  LCL  0  Adductor Tubercle    Pes Anserine tendon    Infrapatellar fat pad  0  Fibular head  0  Popliteal fossa  0  (Blank rows = not tested) Graded on 0-4 scale (0 = no pain, 1 = pain, 2 = pain with wincing/grimacing/flinching, 3 = pain with withdrawal, 4 = unwilling to allow palpation), (Blank rows = not tested)   Passive Accessory Motion Tibiofemoral: Distraction: R: Negative L: Not examined Anterior Glide: R: Negative L: Not examined Posterior Glide: R: Negative L: Negative Medial Glide: R: Not examined L: Not examined Lateral Glide: R: Not examined L: Not examined  Fibula on Femur: Anterior: R: Negative L: Not examined Posterior: R: Negative L: Not examined  Patellofemoral: Superior Glide: R: Negative L: Not examined Inferior Glide: R: Negative L: Not examined Medial Glide: R: Negative L: Not examined Lateral Glide: R: Negative L: Not examined  Medial Tilt: R: Negative L: Not examined Lateral Tilt: R: Negative L: Not examined   VASCULAR Deferred   SPECIAL TESTS  Ligamentous Stability   ACL: Lachman's: R: Negative L: Negative Active Lachman's: R: Not examined L: Not examined Anterior Drawer: R: Negative L: Negative Pivot Shift: R: Negative L: Not examined  PCL: Posterior Drawer: R: Negative L: Negative Reverse Lachman's: R: Negative L: Negative Posterior Sag Sign: R: Negative L: Negative  MCL: Valgus Stress (30 degrees flexion): R: Negative L: Not examined  LCL: Varus Stress (30 degrees flexion): R: Negative L: Not examined  Meniscus Tests McMurray's Test:  Medial Meniscus (Tibial ER): R: Negative L: Not examined Lateral Meniscus (Tibial IR): R: Negative L: Not examined   Patellofemoral Pain Syndrome Patellar Tilt (Lateral): R: Negative L: Not examined Patellar Apprehension: R: Not examined L: Not examined Squatting pain: R: Not examined L: Not examined Stair climbing pain: R: Negative L: Not examined Kneeling pain: R: Negative L: Not examined Resisted knee extension pain: R: Negative L: Negative Compression: R: Positive L: Not examined Clarke's sign: R: Negative L: Not examined Lateral Pull: R: Not examined L: Not examined   Patellar Tendinopathy Inferior pole palpation with anterior tilt: R: Not examined L: Not examined    Beighton Scale: Deferred   [place] Knee Rules for Acute Knee injury  Yes/No         1. Aged 55 years or over Yes   2. Tenderness at the head of the fibula No   3. Isolated tenderness of the patella  No   4. Inability to flex  knee to 90 degrees No   5. Inability to bear weight (defined as an inability to take 4 steps, ie two steps on each leg, regardless of limping) immediately and at presentation No      TODAY'S TREATMENT   SUBJECTIVE: Pt reports that he is doing well today. No significant changes since the last therapy session. Denies knee pain upon arrival.  No knee pain with HEP or since last appointment. No specific questions or concerns currently.   PAIN: Denies  Ther-ex  NuStep L2-3 BLE only (seat 10) x 6 minutes  for warm-up and strengthening during interval history; Total Gym (TG) Level 22 (L22) Mini Squats x 15; TG L22 single leg mini squats LLE 2 x 10, RLE x 10, x 5 (second set discontinued secondary to increase in anterolateral knee pain); Attempted TRX split squats however discontinued quickly due to R knee pain; 6" forward step-ups alternating leading LE x 10 each; 6" lateral step-ups alternating leading LE x 10 each; Swiss ball partial wall sits 1 minute x 3; Swiss ball bridges 2 x 10; Swiss ball hamstring curls 2 x 10;  Hooklying clams with blue tband 2 x 15; Seated RLE LAQ with 7.5# AW x 15, 10# x 15; Forward BOSU (round side up) lunges without UE support x 15 RLE; R Lateral BOSU (round side up) lunges x 15; Hooklying figure 4 R single leg bridges x 10; HEP updated and reviewed with patient   Not performed:  6" step lateral heel taps 2 x 15 BLE; Standing hip exercises with 5# ankle weights (AW) BLE: Hip flexion marches x 15; Hip abduction x 15; Hamstring curls x 15; Hip extension x 15; Resisted side stepping in // bars with green tband around ankles x 6 lengths; Airex tandem stance alternating forward LE with ball tosses to challenge dynamic knee stability x 45s on each;   PATIENT EDUCATION:  Education details: Updated HEP Person educated: Patient Education method: Explanation, demonstration, and handout Education comprehension: verbalized understanding and returned demonstration   HOME EXERCISE PROGRAM: Access Code: [email or web address] URL: [email or web address] Date: 06/14/2021 Prepared by: Roxana Hires  Exercises - Single Leg Heel Raise with Counter Support (Mirrored)  - 1 x daily - 7 x weekly - 2 sets - 20 reps - Mini Squat with Counter Support  - 1 x daily - 7 x weekly - 2 sets - 10 reps - Standing Hip Abduction with Resistance at Ankles and Counter Support  - 1 x daily - 7 x weekly - 2 sets - 10 reps - 2s hold - Standing Hip Extension with Resistance at [employer] (Mirrored)  - 1 x daily - 7 x weekly - 2 sets - 10 reps - 2s hold - Figure 4 Bridge (Mirrored)  - 1 x daily - 7 x weekly - 2 sets - 10 reps - 2s hold - Hooklying Clamshell with Resistance  - 1 x daily - 7 x weekly - 2 sets - 10 reps - 2s hold - Single Leg Stance (Mirrored)  - 1 x daily - 7 x weekly - 3 reps - 30s hold   ASSESSMENT:  CLINICAL IMPRESSION: Patient demonstrates excellent effort during session. He experiences some mild R knee pain during session today so exercises modified accordingly. Progressed BLE strengthening during session adding wall sits and swiss ball exercises. HEP updated with patient to include additional exercises. Plan will be to update outcome measures/goals and next session and consider discharge. Patient will benefit from skilled PT to address above  impairments and improve overall function.  REHAB POTENTIAL: Excellent  CLINICAL DECISION MAKING: Stable/uncomplicated  EVALUATION COMPLEXITY: Low   GOALS: Goals reviewed with patient? Yes  SHORT TERM GOALS: Target date: 06/22/2021   Pt will be independent with HEP to improve strength and decrease knee pain to improve pain-free function at home and work. Baseline:  Goal status: INITIAL   LONG TERM GOALS: Target date: 07/13/21  Pt will increase FOTO to predicted improvement to demonstrate significant improvement in function at home and work related to knee pain  Baseline: 06/01/21: To be completed at next visit Goal status: 06/08/21: Discontinued secondary to FOTO report  2.  Pt will report no further R knee pain in order to improve his ability to walk without any significant increase in pain.  Baseline: 06/01/21: None at time of evaluation. Initially he was having 10/10 when sharp pain would occur. Goal status: INITIAL  3.  Pt will increase strength of R hip abduction and extension by at least 1/2 MMT grade in order to demonstrate improvement in strength and function  Baseline: 06/01/21: 4/5 for both  abduction and extension Goal status: INITIAL   PLAN: PT FREQUENCY: 1-2x/week  PT DURATION: 8 weeks  PLANNED INTERVENTIONS: Therapeutic exercises, Therapeutic activity, Neuromuscular re-education, Balance training, Gait training, Patient/Family education, Joint manipulation, Joint mobilization, Canalith repositioning, Aquatic Therapy, Dry Needling, Electrical stimulation, Spinal manipulation, Spinal mobilization, Cryotherapy, Moist heat, Taping, Traction, Ultrasound, Ionotophoresis '4mg'$ /ml Dexamethasone, and Manual therapy  PLAN FOR NEXT SESSION: progress R hip, knee, and calf as well as core strengthening;  Lyndel Safe Zaynah Chawla PT, DPT, GCS  Patrick Wu 06/14/2021, 12:51 PM

## 2021-06-30 NOTE — Therapy (Signed)
OUTPATIENT PHYSICAL THERAPY KNEE TREATMENT/DISCHARGE   Patient Name: Patrick Wu MRN: [id number] DOB:08/04/1933, 88 y.o., male Today's Date: 07/01/2021   PT End of Session - 07/01/21 1311     Visit Number 4    Number of Visits 7    Date for PT Re-Evaluation 07/13/21    Authorization Type eval: 06/01/21    PT Start Time 1315    PT Stop Time 1400    PT Time Calculation (min) 45 min    Activity Tolerance Patient tolerated treatment well    Behavior During Therapy [facility] for tasks assessed/performed                Past Medical History:  Diagnosis Date   Asthma    Basal cell carcinoma 03/26/2019   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   PCP: Patrick Gambles, MD  REFERRING PROVIDER: Assunta Gambles, MD  REFERRING DIAGNOSIS: M25.561 (ICD-10-CM) - Pain in right knee  THERAPY DIAG: Chronic pain of right knee  RATIONALE FOR EVALUATION AND TREATMENT: Rehabilitation  ONSET DATE: 03/10/21 (approximate)  FOLLOW UP APPT WITH PROVIDER: No, pt advised to call as needed  FROM INITIAL EVALUATION  SUBJECTIVE:                                                                                                                                                                                         Chief Complaint: R knee pain  Pertinent History Patrick Wu is a 88 y.o. male self-referred to Patrick Wu with [facility] orthopedics due to R knee pain. Symptoms initially began 7 years prior. At that time, he was evaluated at [facility] by Patrick Wu. An MRI from 2016 showed a horizontal tear of the posterior horn of the medial meniscus, a horizontal tear of the anterior horn/body junction of the lateral meniscus, and early tricompartmenal degenerative changes of the knee, most prominently affecting the  patellofemoral compartment. He had a corticosteroid injection with resolution of his pain until around the beginning of March 2023 when his symptoms returned. At the time his symptoms returned he reports very intermittent and sudden onset of R knee pain when walking. Symptoms would almost immediately resolve. He saw Patrick Wu who injected his R knee with 1 mL of Kenalog 58m, 2 mL of 1% Lidocaine, and 2 mL of 0.5% Ropivicaine without complication. He was referred for physical therapy to work on core, hip, and knee strengthening exercises. He is  a professor Doctor, general practice at [employer].  Pain:  Pain Intensity: Present: 0/10, Best: 0/10, Worst: 10/10 (none recently) Pain location: R knee joint, pt has difficulty localizing pain when it occurs because it is brief Pain quality: sharp  Radiating pain: No  Swelling: Yes, chronic ongoing swelling in R knee Popping, catching, locking: Yes, momentary catch but no locking when pain occurs Numbness/Tingling: Yes, chronic ongoing BLE numbness, worse in the RLE from the knee to the foot Focal weakness or buckling: Yes, with pain patients R knee feels like it is going to buckle Aggravating factors: Walking Relieving factors: Pain is very brief 24-hour pain behavior: Randomly occurs with walking History of prior back, hip, or knee injury, pain, surgery, or therapy: Yes, history of chronic lumbar stenosis, prior history of R knee pain which resolved after steroid injection Falls: Has patient fallen in last 6 months? No Dominant hand: right Imaging: Yes, 04/21/21: 4 views of the right knee (weightbearing AP, Lutricia Feil, merchant, lateral) were obtained.  There are no fractures or dislocations noted. There are mild tricompartmental degenerative changes with small osteophyte formation about the medial and lateral compartments as more prominent osteophyte formation about the patella.  Joint space appears to be relatively preserved on the medial side, but moderately  narrowed on the lateral side. There does appear to be a joint effusion present.  Prior level of function: Independent Occupational demands: He is a professor Doctor, general practice at [employer]. Hobbies: Walking, working in the yard; Red flags: Negative for chills/fever, night sweats, nausea, vomiting, unrelenting pain  Precautions: None  Weight Bearing Restrictions: No  Living Environment Lives with: lives with their spouse Lives in: House/apartment, pt denies any problems navigating house  Patient Goals: Strengthen RLE to prevent pain from recurring   OBJECTIVE:   Cognition Patient is oriented to person, place, and time.  Recent memory is intact.  Remote memory is intact.  Attention span and concentration are intact.  Expressive speech is intact.  Patient's fund of knowledge is within normal limits for educational level.    Gross Musculoskeletal Assessment Tremor: None Bulk: Normal Tone: Normal Swelling noted around R knee joint inferior to patella on both medial and lateral sides;  GAIT: No gross deficits noted in gait when ambulating in clinic however full gait assessment deferred  Posture: Deferred  AROM  AROM (Normal range in degrees) AROM  07/01/2021  Lumbar   Flexion (65)   Extension (30)   Right lateral flexion (25)   Left lateral flexion (25)   Right rotation (30)   Left rotation (30)       Hip Right Left  Flexion (125) WNL   Extension (15)    Abduction (40) WNL   Adduction  WNL   Internal Rotation (45) WNL   External Rotation (45) WNL       Knee    Flexion (135) Full, pain with overpressure   Extension (0) Full       Ankle    Dorsiflexion (20)    Plantarflexion (50)    Inversion (35)    Eversion (15    (* = pain; Blank rows = not tested)   LE MMT:  MMT (out of 5) Right 07/01/2021 Left 07/01/2021  Hip flexion 5 5  Hip extension 4 4  Hip abduction 4 4-  Hip adduction 4+ 4  Hip internal rotation 5 5  Hip external rotation 5 5  Knee  flexion 4 4  Knee extension 5 5  Ankle dorsiflexion 5 5  Ankle plantarflexion    Ankle inversion    Ankle eversion    (* = pain; Blank rows = not tested)   Sensation Grossly intact to light touch bilateral LEs as determined by testing dermatomes L2-S2. Proprioception, and hot/cold testing deferred on this date.   Reflexes Deferred  Muscle Length Hamstrings: R: Positive for restriction at 75 degrees L: Positive for restrction at 80 degrees Quadriceps Pat Patrick): R: Not examined L: Not examined Hip flexors Marcello Moores): R: Not examined L: Not examined IT band Nicoletta Dress): R: Negative L: Not examined   Palpation  Location LEFT  RIGHT           Quadriceps  0  Medial Hamstrings  0  Lateral Hamstrings  0  Lateral Hamstring tendon  0  Medial Hamstring tendon  0  Quadriceps tendon  0  Patella  0  Patellar Tendon  0  Tibial Tuberosity  0  Medial joint line  1  Lateral joint line  0  MCL  0  LCL  0  Adductor Tubercle    Pes Anserine tendon    Infrapatellar fat pad  0  Fibular head  0  Popliteal fossa  0  (Blank rows = not tested) Graded on 0-4 scale (0 = no pain, 1 = pain, 2 = pain with wincing/grimacing/flinching, 3 = pain with withdrawal, 4 = unwilling to allow palpation), (Blank rows = not tested)   Passive Accessory Motion Tibiofemoral: Distraction: R: Negative L: Not examined Anterior Glide: R: Negative L: Not examined Posterior Glide: R: Negative L: Negative Medial Glide: R: Not examined L: Not examined Lateral Glide: R: Not examined L: Not examined  Fibula on Femur: Anterior: R: Negative L: Not examined Posterior: R: Negative L: Not examined  Patellofemoral: Superior Glide: R: Negative L: Not examined Inferior Glide: R: Negative L: Not examined Medial Glide: R: Negative L: Not examined Lateral Glide: R: Negative L: Not examined  Medial Tilt: R: Negative L: Not examined Lateral Tilt: R: Negative L: Not examined   VASCULAR Deferred   SPECIAL TESTS  Ligamentous  Stability  ACL: Lachman's: R: Negative L: Negative Active Lachman's: R: Not examined L: Not examined Anterior Drawer: R: Negative L: Negative Pivot Shift: R: Negative L: Not examined  PCL: Posterior Drawer: R: Negative L: Negative Reverse Lachman's: R: Negative L: Negative Posterior Sag Sign: R: Negative L: Negative  MCL: Valgus Stress (30 degrees flexion): R: Negative L: Not examined  LCL: Varus Stress (30 degrees flexion): R: Negative L: Not examined  Meniscus Tests McMurray's Test:  Medial Meniscus (Tibial ER): R: Negative L: Not examined Lateral Meniscus (Tibial IR): R: Negative L: Not examined   Patellofemoral Pain Syndrome Patellar Tilt (Lateral): R: Negative L: Not examined Patellar Apprehension: R: Not examined L: Not examined Squatting pain: R: Not examined L: Not examined Stair climbing pain: R: Negative L: Not examined Kneeling pain: R: Negative L: Not examined Resisted knee extension pain: R: Negative L: Negative Compression: R: Positive L: Not examined Clarke's sign: R: Negative L: Not examined Lateral Pull: R: Not examined L: Not examined   Patellar Tendinopathy Inferior pole palpation with anterior tilt: R: Not examined L: Not examined    Beighton Scale: Deferred   [place] Knee Rules for Acute Knee injury  Yes/No         1. Aged 55 years or over Yes   2. Tenderness at the head of the fibula No   3. Isolated tenderness of the patella  No   4. Inability to flex  knee to 90 degrees No   5. Inability to bear weight (defined as an inability to take 4 steps, ie two steps on each leg, regardless of limping) immediately and at presentation No      TODAY'S TREATMENT   SUBJECTIVE: Pt reports that he is doing well today. No significant changes since the last therapy session. Denies knee pain upon arrival.  No knee pain with HEP or since last appointment. No specific questions or concerns currently.   PAIN: Denies  Ther-ex  NuStep L2-3 BLE only (seat 10) x  6 minutes for warm-up and strengthening during interval history; Strength testing hip abduction and extension 4+/5 bilateral hip extension, 4+/5 R hip abduction 4/5 L hip abduction Total Gym (TG) Level 22 (L22) Mini Squats 2 x 15; TG L15 R single leg squats 2 x 10; 6" forward step-ups alternating leading with RLE x 10 each; 6" lateral heel taps in R single leg stance x 10 each; R single leg heel raises off step x 15; Forward BOSU (round side up) lunges without UE support 2 x 15 RLE; R lateral BOSU (round side up) lunges 2 x 15; Standing hip abduction with black tband around ankles 2 x 15 BLE; Standing hip extension with black tband around ankles 2 x 15 BLE; R single leg balance on Airex pad 2 x 30s; HEP review   PATIENT EDUCATION:  Education details: HEP review Person educated: Patient Education method: Explanation Education comprehension: verbalized understanding    HOME EXERCISE PROGRAM: Access Code: [email or web address] URL: [email or web address] Date: 06/14/2021 Prepared by: Roxana Hires  Exercises - Single Leg Heel Raise with Counter Support (Mirrored)  - 1 x daily - 7 x weekly - 2 sets - 20 reps - Mini Squat with Counter Support  - 1 x daily - 7 x weekly - 2 sets - 10 reps - Standing Hip Abduction with Resistance at Ankles and Counter Support  - 1 x daily - 7 x weekly - 2 sets - 10 reps - 2s hold - Standing Hip Extension with Resistance at [employer] (Mirrored)  - 1 x daily - 7 x weekly - 2 sets - 10 reps - 2s hold - Figure 4 Bridge (Mirrored)  - 1 x daily - 7 x weekly - 2 sets - 10 reps - 2s hold - Hooklying Clamshell with Resistance  - 1 x daily - 7 x weekly - 2 sets - 10 reps - 2s hold - Single Leg Stance (Mirrored)  - 1 x daily - 7 x weekly - 3 reps - 30s hold   ASSESSMENT:  CLINICAL IMPRESSION: Patient demonstrates excellent effort during session. No R knee pain reported during session. Pt denies any R knee pain since the initial evaluation. His R hip  extension and abduction strength have both improved from 4/5 initially to 4+/5 today. Strengthening performed with patient today and HEP reviewed. Pt has met all of his goals and has no need for additional therapy. He will be discharged on this date.    REHAB POTENTIAL: Excellent  CLINICAL DECISION MAKING: Stable/uncomplicated  EVALUATION COMPLEXITY: Low   GOALS: Goals reviewed with patient? Yes  SHORT TERM GOALS: Target date: 06/22/2021   Pt will be independent with HEP to improve strength and decrease knee pain to improve pain-free function at home and work. Baseline:  Goal status: INITIAL   LONG TERM GOALS: Target date: 07/13/21  Pt will increase FOTO to predicted improvement to demonstrate significant improvement in function at home and work related to  knee pain  Baseline: 06/01/21: To be completed at next visit Goal status: 06/08/21: Discontinued secondary to FOTO report  2.  Pt will report no further R knee pain in order to improve his ability to walk without any significant increase in pain.  Baseline: 06/01/21: None at time of evaluation. Initially he was having 10/10 when sharp pain would occur. 07/01/21: No pain with walking Goal status: ACHIEVED  3.  Pt will increase strength of R hip abduction and extension by at least 1/2 MMT grade in order to demonstrate improvement in strength and function  Baseline: 06/01/21: 4/5 for both abduction and extension, 07/01/21: 4+/5 for both abduction and extension Goal status: ACHIEVED   PLAN: PT FREQUENCY: 1-2x/week  PT DURATION: 8 weeks  PLANNED INTERVENTIONS: Therapeutic exercises, Therapeutic activity, Neuromuscular re-education, Balance training, Gait training, Patient/Family education, Joint manipulation, Joint mobilization, Canalith repositioning, Aquatic Therapy, Dry Needling, Electrical stimulation, Spinal manipulation, Spinal mobilization, Cryotherapy, Moist heat, Taping, Traction, Ultrasound, Ionotophoresis 27m/ml Dexamethasone,  and Manual therapy  PLAN FOR NEXT SESSION: Discharge  JLyndel SafeHuprich PT, DPT, GCS  Junita Kubota 07/01/2021, 3:48 PM

## 2021-07-01 ENCOUNTER — Ambulatory Visit: Payer: Medicare Other

## 2021-07-01 DIAGNOSIS — G8929 Other chronic pain: Secondary | ICD-10-CM

## 2021-07-01 DIAGNOSIS — M25561 Pain in right knee: Secondary | ICD-10-CM | POA: Diagnosis not present

## 2021-08-26 ENCOUNTER — Encounter: Payer: Self-pay | Admitting: Dermatology

## 2021-08-26 ENCOUNTER — Ambulatory Visit (INDEPENDENT_AMBULATORY_CARE_PROVIDER_SITE_OTHER): Payer: Medicare Other | Admitting: Dermatology

## 2021-08-26 DIAGNOSIS — L814 Other melanin hyperpigmentation: Secondary | ICD-10-CM | POA: Diagnosis not present

## 2021-08-26 DIAGNOSIS — Z1283 Encounter for screening for malignant neoplasm of skin: Secondary | ICD-10-CM

## 2021-08-26 DIAGNOSIS — D492 Neoplasm of unspecified behavior of bone, soft tissue, and skin: Secondary | ICD-10-CM

## 2021-08-26 DIAGNOSIS — L578 Other skin changes due to chronic exposure to nonionizing radiation: Secondary | ICD-10-CM

## 2021-08-26 DIAGNOSIS — L570 Actinic keratosis: Secondary | ICD-10-CM | POA: Diagnosis not present

## 2021-08-26 DIAGNOSIS — Z85828 Personal history of other malignant neoplasm of skin: Secondary | ICD-10-CM | POA: Diagnosis not present

## 2021-08-26 DIAGNOSIS — C44319 Basal cell carcinoma of skin of other parts of face: Secondary | ICD-10-CM | POA: Diagnosis not present

## 2021-08-26 DIAGNOSIS — D229 Melanocytic nevi, unspecified: Secondary | ICD-10-CM

## 2021-08-26 DIAGNOSIS — L821 Other seborrheic keratosis: Secondary | ICD-10-CM

## 2021-08-26 DIAGNOSIS — D1800 Hemangioma unspecified site: Secondary | ICD-10-CM

## 2021-08-26 NOTE — Progress Notes (Signed)
Follow-Up Visit   Subjective  Patrick Wu is a 88 y.o. male who presents for the following: FBSE (The patient presents for Total-Body Skin Exam (TBSE) for skin cancer screening and mole check.  The patient has spots, moles and lesions to be evaluated, some may be new or changing and the patient has concerns that these could be cancer. Patient with hx of BCC and AK's. ).  Patient did have PDT 3/23 to face and advises he did not have any reaction to skin. He did have problems with his eyes for 3 days even though he kept eyes closed and worse goggles during the entire procedure. He said they were uncomfortable and he could not look at the computer, stayed in the house for 3 days.  The following portions of the chart were reviewed this encounter and updated as appropriate:   Tobacco  Allergies  Meds  Problems  Med Hx  Surg Hx  Fam Hx      Review of Systems:  No other skin or systemic complaints except as noted in HPI or Assessment and Plan.  Objective  Well appearing patient in no apparent distress; mood and affect are within normal limits.  A full examination was performed including scalp, head, eyes, ears, nose, lips, neck, chest, axillae, abdomen, back, buttocks, bilateral upper extremities, bilateral lower extremities, hands, feet, fingers, toes, fingernails, and toenails. All findings within normal limits unless otherwise noted below.  Right Preauricular Area 0.7 cm pink macule R/o BCC     right forehead above brow, right forearm, right upper arm, left hand x 4 (7) Erythematous thin papules/macules with gritty scale.     Assessment & Plan  Neoplasm of skin Right Preauricular Area  Skin / nail biopsy Type of biopsy: tangential   Informed consent: discussed and consent obtained   Timeout: patient name, date of birth, surgical site, and procedure verified   Procedure prep:  Patient was prepped and draped in usual sterile fashion Prep type:  Isopropyl  alcohol Anesthesia: the lesion was anesthetized in a standard fashion   Anesthetic:  1% lidocaine w/ epinephrine 1-100,000 buffered w/ 8.4% NaHCO3 Instrument used: flexible razor blade   Hemostasis achieved with: aluminum chloride   Outcome: patient tolerated procedure well   Post-procedure details: wound care instructions given   Additional details:  Petrolatum and a pressure bandage applied  Specimen 1 - Surgical pathology Differential Diagnosis: R/o BCC  Check Margins: No 0.7 cm pink macule   Will refer for Mohs with Dr. Lacinda Axon if + for CA  AK (actinic keratosis) (7) right forehead above brow, right forearm, right upper arm, left hand x 4  Actinic keratoses are precancerous spots that appear secondary to cumulative UV radiation exposure/sun exposure over time. They are chronic with expected duration over 1 year. A portion of actinic keratoses will progress to squamous cell carcinoma of the skin. It is not possible to reliably predict which spots will progress to skin cancer and so treatment is recommended to prevent development of skin cancer.  Recommend daily broad spectrum sunscreen SPF 30+ to sun-exposed areas, reapply every 2 hours as needed.  Recommend staying in the shade or wearing long sleeves, sun glasses (UVA+UVB protection) and wide brim hats (4-inch brim around the entire circumference of the hat). Call for new or changing lesions.  Prior to procedure, discussed risks of blister formation, small wound, skin dyspigmentation, or rare scar following cryotherapy. Recommend Vaseline ointment to treated areas while healing.  With lichenification at right forearm  Destruction of lesion - right forehead above brow, right forearm, right upper arm, left hand x 4  Destruction method: cryotherapy   Informed consent: discussed and consent obtained   Lesion destroyed using liquid nitrogen: Yes   Cryotherapy cycles:  2 Outcome: patient tolerated procedure well with no complications    Post-procedure details: wound care instructions given     History of Basal Cell Carcinoma of the Skin - No evidence of recurrence today - Recommend regular full body skin exams - Recommend daily broad spectrum sunscreen SPF 30+ to sun-exposed areas, reapply every 2 hours as needed.  - Call if any new or changing lesions are noted between office visits  Lentigines - Scattered tan macules - Due to sun exposure - Benign-appearing, observe - Recommend daily broad spectrum sunscreen SPF 30+ to sun-exposed areas, reapply every 2 hours as needed. - Call for any changes  Seborrheic Keratoses - Stuck-on, waxy, tan-brown papules and/or plaques  - Benign-appearing - Discussed benign etiology and prognosis. - Observe - Call for any changes  Melanocytic Nevi - Tan-brown and/or pink-flesh-colored symmetric macules and papules - Benign appearing on exam today - Observation - Call clinic for new or changing moles - Recommend daily use of broad spectrum spf 30+ sunscreen to sun-exposed areas.   Hemangiomas - Red papules - Discussed benign nature - Observe - Call for any changes  Actinic Damage - Chronic condition, secondary to cumulative UV/sun exposure - diffuse scaly erythematous macules with underlying dyspigmentation - Recommend daily broad spectrum sunscreen SPF 30+ to sun-exposed areas, reapply every 2 hours as needed.  - Staying in the shade or wearing long sleeves, sun glasses (UVA+UVB protection) and wide brim hats (4-inch brim around the entire circumference of the hat) are also recommended for sun protection.  - Call for new or changing lesions.  Skin cancer screening performed today.  Return in about 6 months (around 02/26/2022) for TBSE, Hx BCC, Hx AK.  Graciella Belton, RMA, am acting as scribe for Forest Gleason, MD .  Documentation: I have reviewed the above documentation for accuracy and completeness, and I agree with the above.  Forest Gleason, MD

## 2021-08-26 NOTE — Patient Instructions (Addendum)
Wound Care Instructions  Cleanse wound gently with soap and water once a day then pat dry with clean gauze. Apply a thin coat of Petrolatum (petroleum jelly, "Vaseline") over the wound (unless you have an allergy to this). We recommend that you use a new, sterile tube of Vaseline. Do not pick or remove scabs. Do not remove the yellow or white "healing tissue" from the base of the wound.  Cover the wound with fresh, clean, nonstick gauze and secure with paper tape. You may use Band-Aids in place of gauze and tape if the wound is small enough, but would recommend trimming much of the tape off as there is often too much. Sometimes Band-Aids can irritate the skin.  You should call the office for your biopsy report after 1 week if you have not already been contacted.  If you experience any problems, such as abnormal amounts of bleeding, swelling, significant bruising, significant pain, or evidence of infection, please call the office immediately.  FOR ADULT SURGERY PATIENTS: If you need something for pain relief you may take 1 extra strength Tylenol (acetaminophen) AND 2 Ibuprofen ('200mg'$  each) together every 4 hours as needed for pain. (do not take these if you are allergic to them or if you have a reason you should not take them.) Typically, you may only need pain medication for 1 to 3 days.   Cryotherapy Aftercare  Wash gently with soap and water everyday.   Apply Vaseline and Band-Aid daily until healed.   Recommend taking Heliocare sun protection supplement daily in sunny weather for additional sun protection. For maximum protection on the sunniest days, you can take up to 2 capsules of regular Heliocare OR take 1 capsule of Heliocare Ultra. For prolonged exposure (such as a full day in the sun), you can repeat your dose of the supplement 4 hours after your first dose. Heliocare can be purchased at [employer], at some [facility] or at [email or web address].    Melanoma ABCDEs  Melanoma is  the most dangerous type of skin cancer, and is the leading cause of death from skin disease.  You are more likely to develop melanoma if you: Have light-colored skin, light-colored eyes, or red or blond hair Spend a lot of time in the sun Tan regularly, either outdoors or in a tanning bed Have had blistering sunburns, especially during childhood Have a close family member who has had a melanoma Have atypical moles or large birthmarks  Early detection of melanoma is key since treatment is typically straightforward and cure rates are extremely high if we catch it early.   The first sign of melanoma is often a change in a mole or a new dark spot.  The ABCDE system is a way of remembering the signs of melanoma.  A for asymmetry:  The two halves do not match. B for border:  The edges of the growth are irregular. C for color:  A mixture of colors are present instead of an even brown color. D for diameter:  Melanomas are usually (but not always) greater than 52m - the size of a pencil eraser. E for evolution:  The spot keeps changing in size, shape, and color.  Please check your skin once per month between visits. You can use a small mirror in front and a large mirror behind you to keep an eye on the back side or your body.   If you see any new or changing lesions before your next follow-up, please call to schedule a  a visit.  Please continue daily skin protection including broad spectrum sunscreen SPF 30+ to sun-exposed areas, reapplying every 2 hours as needed when you're outdoors.    Due to recent changes in healthcare laws, you may see results of your pathology and/or laboratory studies on MyChart before the doctors have had a chance to review them. We understand that in some cases there may be results that are confusing or concerning to you. Please understand that not all results are received at the same time and often the doctors may need to interpret multiple results in order to provide you  with the best plan of care or course of treatment. Therefore, we ask that you please give us 2 business days to thoroughly review all your results before contacting the office for clarification. Should we see a critical lab result, you will be contacted sooner.   If You Need Anything After Your Visit  If you have any questions or concerns for your doctor, please call our main line at [phone] and press option 4 to reach your doctor's medical assistant. If no one answers, please leave a voicemail as directed and we will return your call as soon as possible. Messages left after 4 pm will be answered the following business day.   You may also send us a message via MyChart. We typically respond to MyChart messages within 1-2 business days.  For prescription refills, please ask your pharmacy to contact our office. Our fax number is [phone].  If you have an urgent issue when the clinic is closed that cannot wait until the next business day, you can page your doctor at the number below.    Please note that while we do our best to be available for urgent issues outside of office hours, we are not available 24/7.   If you have an urgent issue and are unable to reach us, you may choose to seek medical care at your doctor's office, retail clinic, urgent care center, or emergency room.  If you have a medical emergency, please immediately call 911 or go to the emergency department.  Pager Numbers  - Dr. Kowalski: [phone]  - Dr. Moye: [phone]  - Dr. Stewart: [phone]  In the event of inclement weather, please call our main line at [phone] for an update on the status of any delays or closures.  Dermatology Medication Tips: Please keep the boxes that topical medications come in in order to help keep track of the instructions about where and how to use these. Pharmacies typically print the medication instructions only on the boxes and not directly on the medication tubes.    If your medication is too expensive, please contact our office at [phone] option 4 or send us a message through MyChart.   We are unable to tell what your co-pay for medications will be in advance as this is different depending on your insurance coverage. However, we may be able to find a substitute medication at lower cost or fill out paperwork to get insurance to cover a needed medication.   If a prior authorization is required to get your medication covered by your insurance company, please allow us 1-2 business days to complete this process.  Drug prices often vary depending on where the prescription is filled and some pharmacies may offer cheaper prices.  The website www.goodrx.com contains coupons for medications through different pharmacies. The prices here do not account for what the cost may be with help from insurance (it may be cheaper   with your insurance), but the website can give you the price if you did not use any insurance.  - You can print the associated coupon and take it with your prescription to the pharmacy.  - You may also stop by our office during regular business hours and pick up a GoodRx coupon card.  - If you need your prescription sent electronically to a different pharmacy, notify our office through [facility] MyChart or by phone at [phone] option 4.     Si Usted Necesita Algo Despus de Su Visita  Tambin puede enviarnos un mensaje a travs de MyChart. Por lo general respondemos a los mensajes de MyChart en el transcurso de 1 a 2 das hbiles.  Para renovar recetas, por favor pida a su farmacia que se ponga en contacto con nuestra oficina. Nuestro nmero de fax es el [phone].  Si tiene un asunto urgente cuando la clnica est cerrada y que no puede esperar hasta el siguiente da hbil, puede llamar/localizar a su doctor(a) al nmero que aparece a continuacin.   Por favor, tenga en cuenta que aunque hacemos todo lo posible para estar  disponibles para asuntos urgentes fuera del horario de oficina, no estamos disponibles las 24 horas del da, los 7 das de la semana.   Si tiene un problema urgente y no puede comunicarse con nosotros, puede optar por buscar atencin mdica  en el consultorio de su doctor(a), en una clnica privada, en un centro de atencin urgente o en una sala de emergencias.  Si tiene una emergencia mdica, por favor llame inmediatamente al 911 o vaya a la sala de emergencias.  Nmeros de bper  - Dr. Kowalski: [phone]  - Dra. Moye: [phone]  - Dra. Stewart: [phone]  En caso de inclemencias del tiempo, por favor llame a nuestra lnea principal al [phone] para una actualizacin sobre el estado de cualquier retraso o cierre.  Consejos para la medicacin en dermatologa: Por favor, guarde las cajas en las que vienen los medicamentos de uso tpico para ayudarle a seguir las instrucciones sobre dnde y cmo usarlos. Las farmacias generalmente imprimen las instrucciones del medicamento slo en las cajas y no directamente en los tubos del medicamento.   Si su medicamento es muy caro, por favor, pngase en contacto con nuestra oficina llamando al [phone] y presione la opcin 4 o envenos un mensaje a travs de MyChart.   No podemos decirle cul ser su copago por los medicamentos por adelantado ya que esto es diferente dependiendo de la cobertura de su seguro. Sin embargo, es posible que podamos encontrar un medicamento sustituto a menor costo o llenar un formulario para que el seguro cubra el medicamento que se considera necesario.   Si se requiere una autorizacin previa para que su compaa de seguros cubra su medicamento, por favor permtanos de 1 a 2 das hbiles para completar este proceso.  Los precios de los medicamentos varan con frecuencia dependiendo del lugar de dnde se surte la receta y alguna farmacias pueden ofrecer precios ms baratos.  El sitio web www.goodrx.com tiene  cupones para medicamentos de diferentes farmacias. Los precios aqu no tienen en cuenta lo que podra costar con la ayuda del seguro (puede ser ms barato con su seguro), pero el sitio web puede darle el precio si no utiliz ningn seguro.  - Puede imprimir el cupn correspondiente y llevarlo con su receta a la farmacia.  - Tambin puede pasar por nuestra oficina durante el horario de atencin regular y recoger una   de cupones de GoodRx.  - Si necesita que su receta se enve electrnicamente a una farmacia diferente, informe a nuestra oficina a travs de MyChart de [facility] o por telfono llamando al [phone] y presione la opcin 4.

## 2021-08-31 ENCOUNTER — Telehealth: Payer: Self-pay

## 2021-08-31 ENCOUNTER — Other Ambulatory Visit: Payer: Self-pay

## 2021-08-31 DIAGNOSIS — C44319 Basal cell carcinoma of skin of other parts of face: Secondary | ICD-10-CM

## 2021-08-31 NOTE — Telephone Encounter (Signed)
Dr. Laurence Ferrari discussed pathology results with patient. Referral sent to Dr. Lacinda Axon for Mohs surgery. JP

## 2021-08-31 NOTE — Telephone Encounter (Signed)
-----   Message from Alfonso Patten, MD sent at 08/31/2021  1:10 PM EDT ----- Skin , right preauricular area BASAL CELL CARCINOMA, NODULAR AND INFILTRATIVE PATTERNS, BASE INVOLVED --> Mohs with Dr. Lacinda Axon. Discussed with patient.  MAs please refer. Thank you!

## 2022-01-24 ENCOUNTER — Telehealth: Payer: Self-pay

## 2022-01-24 NOTE — Telephone Encounter (Signed)
Updated specimen tracking and history. aw

## 2022-03-02 ENCOUNTER — Encounter: Payer: Medicare Other | Admitting: Dermatology

## 2022-04-14 ENCOUNTER — Encounter: Payer: Self-pay | Admitting: Dermatology

## 2022-04-14 ENCOUNTER — Ambulatory Visit (INDEPENDENT_AMBULATORY_CARE_PROVIDER_SITE_OTHER): Payer: Medicare Other | Admitting: Dermatology

## 2022-04-14 VITALS — BP 150/87 | HR 56

## 2022-04-14 DIAGNOSIS — L570 Actinic keratosis: Secondary | ICD-10-CM | POA: Diagnosis not present

## 2022-04-14 DIAGNOSIS — Z85828 Personal history of other malignant neoplasm of skin: Secondary | ICD-10-CM | POA: Diagnosis not present

## 2022-04-14 DIAGNOSIS — L578 Other skin changes due to chronic exposure to nonionizing radiation: Secondary | ICD-10-CM | POA: Diagnosis not present

## 2022-04-14 DIAGNOSIS — Z1283 Encounter for screening for malignant neoplasm of skin: Secondary | ICD-10-CM | POA: Diagnosis not present

## 2022-04-14 DIAGNOSIS — L304 Erythema intertrigo: Secondary | ICD-10-CM

## 2022-04-14 DIAGNOSIS — L821 Other seborrheic keratosis: Secondary | ICD-10-CM

## 2022-04-14 DIAGNOSIS — L814 Other melanin hyperpigmentation: Secondary | ICD-10-CM

## 2022-04-14 DIAGNOSIS — D229 Melanocytic nevi, unspecified: Secondary | ICD-10-CM

## 2022-04-14 MED ORDER — ECONAZOLE NITRATE 1 % EX CREA: TOPICAL_CREAM | Freq: Two times a day (BID) | CUTANEOUS | 11 refills | Status: AC | PRN

## 2022-04-14 NOTE — Patient Instructions (Addendum)
Cryotherapy Aftercare  Wash gently with soap and water everyday.   Apply Vaseline daily until healed.    Recommend daily broad spectrum sunscreen SPF 30+ to sun-exposed areas, reapply every 2 hours as needed. Call for new or changing lesions.  Staying in the shade or wearing long sleeves, sun glasses (UVA+UVB protection) and wide brim hats (4-inch brim around the entire circumference of the hat) are also recommended for sun protection.    Recommend taking Heliocare sun protection supplement daily in sunny weather for additional sun protection. For maximum protection on the sunniest days, you can take up to 2 capsules of regular Heliocare OR take 1 capsule of Heliocare Ultra. For prolonged exposure (such as a full day in the sun), you can repeat your dose of the supplement 4 hours after your first dose. Heliocare can be purchased at [facility], at some [facility] or at www.heliocare.com.    Melanoma ABCDEs  Melanoma is the most dangerous type of skin cancer, and is the leading cause of death from skin disease.  You are more likely to develop melanoma if you: Have light-colored skin, light-colored eyes, or red or blond hair Spend a lot of time in the sun Tan regularly, either outdoors or in a tanning bed Have had blistering sunburns, especially during childhood Have a close family member who has had a melanoma Have atypical moles or large birthmarks  Early detection of melanoma is key since treatment is typically straightforward and cure rates are extremely high if we catch it early.   The first sign of melanoma is often a change in a mole or a new dark spot.  The ABCDE system is a way of remembering the signs of melanoma.  A for asymmetry:  The two halves do not match. B for border:  The edges of the growth are irregular. C for color:  A mixture of colors are present instead of an even brown color. D for diameter:  Melanomas are usually (but not always) greater than 6mm - the  size of a pencil eraser. E for evolution:  The spot keeps changing in size, shape, and color.  Please check your skin once per month between visits. You can use a small mirror in front and a large mirror behind you to keep an eye on the back side or your body.   If you see any new or changing lesions before your next follow-up, please call to schedule a visit.  Please continue daily skin protection including broad spectrum sunscreen SPF 30+ to sun-exposed areas, reapplying every 2 hours as needed when you're outdoors.   Staying in the shade or wearing long sleeves, sun glasses (UVA+UVB protection) and wide brim hats (4-inch brim around the entire circumference of the hat) are also recommended for sun protection.    Due to recent changes in healthcare laws, you may see results of your pathology and/or laboratory studies on MyChart before the doctors have had a chance to review them. We understand that in some cases there may be results that are confusing or concerning to you. Please understand that not all results are received at the same time and often the doctors may need to interpret multiple results in order to provide you with the best plan of care or course of treatment. Therefore, we ask that you please give us 2 business days to thoroughly review all your results before contacting the office for clarification. Should we see a critical lab result, you will be contacted sooner.     If You Need Anything After Your Visit  If you have any questions or concerns for your doctor, please call our main line at [phone] and press option 4 to reach your doctor's medical assistant. If no one answers, please leave a voicemail as directed and we will return your call as soon as possible. Messages left after 4 pm will be answered the following business day.   You may also send us a message via MyChart. We typically respond to MyChart messages within 1-2 business days.  For prescription refills, please  ask your pharmacy to contact our office. Our fax number is [phone].  If you have an urgent issue when the clinic is closed that cannot wait until the next business day, you can page your doctor at the number below.    Please note that while we do our best to be available for urgent issues outside of office hours, we are not available 24/7.   If you have an urgent issue and are unable to reach us, you may choose to seek medical care at your doctor's office, retail clinic, urgent care center, or emergency room.  If you have a medical emergency, please immediately call 911 or go to the emergency department.  Pager Numbers  - Dr. Kowalski: [phone]  - Dr. Moye: [phone]  - Dr. Stewart: [phone]  In the event of inclement weather, please call our main line at [phone] for an update on the status of any delays or closures.  Dermatology Medication Tips: Please keep the boxes that topical medications come in in order to help keep track of the instructions about where and how to use these. Pharmacies typically print the medication instructions only on the boxes and not directly on the medication tubes.   If your medication is too expensive, please contact our office at [phone] option 4 or send us a message through MyChart.   We are unable to tell what your co-pay for medications will be in advance as this is different depending on your insurance coverage. However, we may be able to find a substitute medication at lower cost or fill out paperwork to get insurance to cover a needed medication.   If a prior authorization is required to get your medication covered by your insurance company, please allow us 1-2 business days to complete this process.  Drug prices often vary depending on where the prescription is filled and some pharmacies may offer cheaper prices.  The website www.goodrx.com contains coupons for medications through different pharmacies. The prices here do  not account for what the cost may be with help from insurance (it may be cheaper with your insurance), but the website can give you the price if you did not use any insurance.  - You can print the associated coupon and take it with your prescription to the pharmacy.  - You may also stop by our office during regular business hours and pick up a GoodRx coupon card.  - If you need your prescription sent electronically to a different pharmacy, notify our office through [facility] MyChart or by phone at [phone] option 4.     Si Usted Necesita Algo Despus de Su Visita  Tambin puede enviarnos un mensaje a travs de MyChart. Por lo general respondemos a los mensajes de MyChart en el transcurso de 1 a 2 das hbiles.  Para renovar recetas, por favor pida a su farmacia que se ponga en contacto con nuestra oficina. Nuestro nmero de fax es el [phone].  Si   tiene un asunto urgente cuando la clnica est cerrada y que no puede esperar hasta el siguiente da hbil, puede llamar/localizar a su doctor(a) al nmero que aparece a continuacin.   Por favor, tenga en cuenta que aunque hacemos todo lo posible para estar disponibles para asuntos urgentes fuera del horario de oficina, no estamos disponibles las 24 horas del da, los 7 das de la semana.   Si tiene un problema urgente y no puede comunicarse con nosotros, puede optar por buscar atencin mdica  en el consultorio de su doctor(a), en una clnica privada, en un centro de atencin urgente o en una sala de emergencias.  Si tiene una emergencia mdica, por favor llame inmediatamente al 911 o vaya a la sala de emergencias.  Nmeros de bper  - Dr. Kowalski: [phone]  - Dra. Moye: [phone]  - Dra. Stewart: [phone]  En caso de inclemencias del tiempo, por favor llame a nuestra lnea principal al [phone] para una actualizacin sobre el estado de cualquier retraso o cierre.  Consejos para la medicacin en dermatologa: Por  favor, guarde las cajas en las que vienen los medicamentos de uso tpico para ayudarle a seguir las instrucciones sobre dnde y cmo usarlos. Las farmacias generalmente imprimen las instrucciones del medicamento slo en las cajas y no directamente en los tubos del medicamento.   Si su medicamento es muy caro, por favor, pngase en contacto con nuestra oficina llamando al [phone] y presione la opcin 4 o envenos un mensaje a travs de MyChart.   No podemos decirle cul ser su copago por los medicamentos por adelantado ya que esto es diferente dependiendo de la cobertura de su seguro. Sin embargo, es posible que podamos encontrar un medicamento sustituto a menor costo o llenar un formulario para que el seguro cubra el medicamento que se considera necesario.   Si se requiere una autorizacin previa para que su compaa de seguros cubra su medicamento, por favor permtanos de 1 a 2 das hbiles para completar este proceso.  Los precios de los medicamentos varan con frecuencia dependiendo del lugar de dnde se surte la receta y alguna farmacias pueden ofrecer precios ms baratos.  El sitio web www.goodrx.com tiene cupones para medicamentos de diferentes farmacias. Los precios aqu no tienen en cuenta lo que podra costar con la ayuda del seguro (puede ser ms barato con su seguro), pero el sitio web puede darle el precio si no utiliz ningn seguro.  - Puede imprimir el cupn correspondiente y llevarlo con su receta a la farmacia.  - Tambin puede pasar por nuestra oficina durante el horario de atencin regular y recoger una tarjeta de cupones de GoodRx.  - Si necesita que su receta se enve electrnicamente a una farmacia diferente, informe a nuestra oficina a travs de MyChart de [facility] o por telfono llamando al [phone] y presione la opcin 4.  

## 2022-04-14 NOTE — Progress Notes (Signed)
Follow-Up Visit   Subjective  Patrick Wu is a 89 y.o. male who presents for the following: Skin Cancer Screening and Full Body Skin Exam. Hx of BCCs. Hx of AKs  The patient presents for Total-Body Skin Exam (TBSE) for skin cancer screening and mole check. The patient has spots, moles and lesions to be evaluated, some may be new or changing and the patient has concerns that these could be cancer.  The following portions of the chart were reviewed this encounter and updated as appropriate: medications, allergies, medical history  Review of Systems:  No other skin or systemic complaints except as noted in HPI or Assessment and Plan.  Objective  Well appearing patient in no apparent distress; mood and affect are within normal limits.  A full examination was performed including scalp, head, eyes, ears, nose, lips, neck, chest, axillae, abdomen, back, buttocks, bilateral upper extremities, bilateral lower extremities, hands, feet, fingers, toes, fingernails, and toenails. All findings within normal limits unless otherwise noted below.   Relevant physical exam findings are noted in the Assessment and Plan.    Assessment & Plan   HISTORY OF BASAL CELL CARCINOMA OF THE SKIN. Glabella, Mohs 07/02/2019. [facility]. Right preauricular, Mohs 12/29/2021.  - No evidence of recurrence today - Recommend regular full body skin exams - Recommend daily broad spectrum sunscreen SPF 30+ to sun-exposed areas, reapply every 2 hours as needed.  - Call if any new or changing lesions are noted between office visits   LENTIGINES, SEBORRHEIC KERATOSES, HEMANGIOMAS - Benign normal skin lesions - Benign-appearing - Call for any changes  MELANOCYTIC NEVI - Tan-brown and/or pink-flesh-colored symmetric macules and papules - Benign appearing on exam today - Observation - Call clinic for new or changing moles - Recommend daily use of broad spectrum spf 30+ sunscreen to sun-exposed  areas.   ACTINIC DAMAGE - Chronic condition, secondary to cumulative UV/sun exposure - diffuse scaly erythematous macules with underlying dyspigmentation - Recommend daily broad spectrum sunscreen SPF 30+ to sun-exposed areas, reapply every 2 hours as needed.  - Staying in the shade or wearing long sleeves, sun glasses (UVA+UVB protection) and wide brim hats (4-inch brim around the entire circumference of the hat) are also recommended for sun protection.  - Call for new or changing lesions.  SKIN CANCER SCREENING PERFORMED TODAY.  ACTINIC KERATOSIS Exam: Erythematous thin papules/macules with gritty scale  Actinic keratoses are precancerous spots that appear secondary to cumulative UV radiation exposure/sun exposure over time. They are chronic with expected duration over 1 year. A portion of actinic keratoses will progress to squamous cell carcinoma of the skin. It is not possible to reliably predict which spots will progress to skin cancer and so treatment is recommended to prevent development of skin cancer.  Recommend daily broad spectrum sunscreen SPF 30+ to sun-exposed areas, reapply every 2 hours as needed.  Recommend staying in the shade or wearing long sleeves, sun glasses (UVA+UVB protection) and wide brim hats (4-inch brim around the entire circumference of the hat). Call for new or changing lesions.  Treatment Plan:  Prior to procedure, discussed risks of blister formation, small wound, skin dyspigmentation, or rare scar following cryotherapy. Recommend Vaseline ointment to treated areas while healing.  Destruction Procedure Note Destruction method: cryotherapy   Informed consent: discussed and consent obtained   Lesion destroyed using liquid nitrogen: Yes   Outcome: patient tolerated procedure well with no complications   Post-procedure details: wound care instructions given   Locations: mid  vertex scalp x1, right frontal x1, right cheek x6, right temple x2, right lateral  brow x2, left forehead x1, left tragus x1, right dorsal hand x1, left helix x1 # of Lesions Treated: 16   INTERTRIGO. Recurrent fungal. Exam Clear today  Chronic condition with duration or expected duration over one year. Currently well-controlled.  Intertrigo is a chronic recurrent rash that occurs in skin fold areas that may be associated with friction; heat; moisture; yeast; fungus; and bacteria.  It is exacerbated by increased movement / activity; sweating; and higher atmospheric temperature.  Treatment Plan continue Econazole cream twice daily as needed.    Return in about 6 months (around 10/14/2022) for TBSE, HxBCCs.  I, Lawson RadarJill Parcell, CMA, am acting as scribe for Darden DatesVIRGINA Kem Hensen, MD.   Documentation: I have reviewed the above documentation for accuracy and completeness, and I agree with the above.  Darden DatesVIRGINA Mikylah Ackroyd, MD

## 2022-07-20 ENCOUNTER — Other Ambulatory Visit: Payer: Self-pay | Admitting: Student

## 2022-07-20 DIAGNOSIS — R29810 Facial weakness: Secondary | ICD-10-CM

## 2022-07-20 DIAGNOSIS — M546 Pain in thoracic spine: Secondary | ICD-10-CM

## 2022-07-25 ENCOUNTER — Ambulatory Visit
Admission: RE | Admit: 2022-07-25 | Discharge: 2022-07-25 | Disposition: A | Discharge status: Home / Self Care | Payer: Medicare Other | Source: Ambulatory Visit | Attending: Student | Admitting: Student

## 2022-07-25 DIAGNOSIS — M546 Pain in thoracic spine: Secondary | ICD-10-CM | POA: Insufficient documentation

## 2022-07-29 ENCOUNTER — Ambulatory Visit
Admission: RE | Admit: 2022-07-29 | Discharge: 2022-07-29 | Disposition: A | Discharge status: Home / Self Care | Payer: Medicare Other | Source: Ambulatory Visit | Attending: Student | Admitting: Student

## 2022-07-29 DIAGNOSIS — R29810 Facial weakness: Secondary | ICD-10-CM | POA: Diagnosis present

## 2022-08-30 ENCOUNTER — Other Ambulatory Visit: Payer: Self-pay | Admitting: Internal Medicine

## 2022-08-30 DIAGNOSIS — G8929 Other chronic pain: Secondary | ICD-10-CM

## 2022-09-06 ENCOUNTER — Ambulatory Visit
Admission: RE | Admit: 2022-09-06 | Discharge: 2022-09-06 | Disposition: A | Discharge status: Home / Self Care | Payer: Medicare Other | Source: Ambulatory Visit | Attending: Internal Medicine | Admitting: Internal Medicine

## 2022-09-06 DIAGNOSIS — R109 Unspecified abdominal pain: Secondary | ICD-10-CM | POA: Insufficient documentation

## 2022-09-06 DIAGNOSIS — G8929 Other chronic pain: Secondary | ICD-10-CM | POA: Insufficient documentation

## 2022-09-06 LAB — POCT I-STAT CREATININE: Creatinine, Ser: 1.2 mg/dL (ref 0.61–1.24)

## 2022-09-06 MED ORDER — IOHEXOL 300 MG/ML  SOLN
100.0000 mL | Freq: Once | INTRAMUSCULAR | Status: AC | PRN
  Administered 2022-09-06: 100 mL via INTRAVENOUS

## 2022-09-15 ENCOUNTER — Other Ambulatory Visit: Payer: Self-pay | Admitting: Internal Medicine

## 2022-09-15 ENCOUNTER — Ambulatory Visit
Admission: RE | Admit: 2022-09-15 | Discharge: 2022-09-15 | Disposition: A | Discharge status: Home / Self Care | Payer: Medicare Other | Source: Ambulatory Visit | Attending: Internal Medicine | Admitting: Internal Medicine

## 2022-09-15 DIAGNOSIS — R600 Localized edema: Secondary | ICD-10-CM | POA: Diagnosis present

## 2022-09-16 ENCOUNTER — Encounter: Payer: Self-pay | Admitting: Internal Medicine

## 2022-10-12 ENCOUNTER — Telehealth: Payer: Self-pay | Admitting: Physical Therapy

## 2022-10-12 NOTE — Telephone Encounter (Signed)
Called pt to ask if he wanted to move up his appointment because of increased availability. He said he would not be able to come in tomorrow because of prior appointments and he would be at his appointment next week.

## 2022-10-17 ENCOUNTER — Encounter: Payer: Self-pay | Admitting: Physical Therapy

## 2022-10-17 ENCOUNTER — Ambulatory Visit: Payer: Medicare Other | Attending: Neurology | Admitting: Physical Therapy

## 2022-10-17 DIAGNOSIS — G8929 Other chronic pain: Secondary | ICD-10-CM | POA: Insufficient documentation

## 2022-10-17 DIAGNOSIS — M546 Pain in thoracic spine: Secondary | ICD-10-CM | POA: Diagnosis present

## 2022-10-17 DIAGNOSIS — M25561 Pain in right knee: Secondary | ICD-10-CM | POA: Insufficient documentation

## 2022-10-17 NOTE — Therapy (Signed)
OUTPATIENT PHYSICAL THERAPY THORACOLUMBAR EVALUATION   Patient Name: Patrick Wu MRN: [id number] DOB:04-06-1933, 89 y.o., male Today's Date: 10/17/2022  END OF SESSION:  PT End of Session - 10/17/22 1109     Visit Number 1    Number of Visits 20    Date for PT Re-Evaluation 12/26/22    Authorization Type Medicare 2024    Authorization - Visit Number 1    Authorization - Number of Visits 20    Progress Note Due on Visit 10    PT Start Time 0945    PT Stop Time 1030    PT Time Calculation (min) 45 min    Activity Tolerance Patient tolerated treatment well    Behavior During Therapy [facility] for tasks assessed/performed             Past Medical History:  Diagnosis Date   Asthma    Basal cell carcinoma 03/26/2019   Nodular. Glabella. Mohs 07/02/2019   Basal cell carcinoma 02/18/2021   R upper back, [facility]   Basal cell carcinoma 08/26/2021   Right preauricular. Nodular and infiltrative. Mohs 12/29/2021   Past Surgical History:  Procedure Laterality Date   APPENDECTOMY     There are no problems to display for this patient.   PCP:   REFERRING PROVIDER: Dr. Cristopher Peru  REFERRING DIAG: [phone] (ICD-10-CM) - Femoral neuropathy of lower extremity, bilateral  Rationale for Evaluation and Treatment: Rehabilitation  THERAPY DIAG:  Pain in thoracic spine  ONSET DATE: June 2024   SUBJECTIVE:                                                                                                                                                                                           SUBJECTIVE STATEMENT: See pertinent history   PERTINENT HISTORY:  Pt reports that he had a compression fracture in his thoracic spine (T9) back in early June. He has since had resulting neuropathic pain that radiates around to his chest. He has taken gabapentin for the pain which has helped some, but he now mostly takes it at night. He experiences most of his pain during afternoon when standing for  long periods of time. If he sits for 5 minutes, then the pain decreases. He describes having a history of a right sided TIA where he lost control of his right leg. Pt was referred for neuropathy but this is unrelated to his current mid back pain.   PAIN:  Are you having pain? Yes: NPRS scale: 4-5/10 Pain location: T9  Pain description: Dull and achy  Aggravating factors: Standing for long periods of time Relieving factors: Sitting down for long  periods of time.   PRECAUTIONS: None  RED FLAGS: None   WEIGHT BEARING RESTRICTIONS: No  FALLS:  Has patient fallen in last 6 months? No  LIVING ENVIRONMENT: Lives with: lives with their spouse Lives in: House/apartment Stairs: No Has following equipment at home: None  OCCUPATION: Multimedia programmer and teaches classes for [employer]   PLOF: Independent  PATIENT GOALS: Decrease mid back pain and improve posture.   NEXT MD VISIT: November 2024   OBJECTIVE:  Note: Objective measures were completed at Evaluation unless otherwise noted.  VITALS: BP 167/82 HR 58 SpO2 98%   DIAGNOSTIC FINDINGS:  CLINICAL DATA:  Thoracic region back pain with radiation around the sides.   EXAM: MRI THORACIC SPINE WITHOUT CONTRAST   TECHNIQUE: Multiplanar, multisequence MR imaging of the thoracic spine was performed. No intravenous contrast was administered.   COMPARISON:  Chest CT 08/20/2018   FINDINGS: Alignment:  Mild scoliotic curvature.   Vertebrae: Acute or subacute inferior endplate fracture at T9 with loss of height of 20%. No retropulsed bone. Old healed superior endplate fracture at T12, unchanged since a chest CT in 2020.   Benign appearing hemangioma within the T7 vertebral body.   Cord:  No cord compression or focal cord lesion.   Paraspinal and other soft tissues: Negative except for a hiatal hernia.   Disc levels:   No significant finding from T1-2 through T5-6.   T6-7: Small central disc protrusion indents the thecal sac  slightly but does not cause any neural compression.   T7-8 and T8-9: No disc abnormality or significant stenosis. 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   Inferior endplate compression fracture at T9 as noted above with marrow edema. Bilateral facet joint effusions and posterior element edema. No compressive stenosis of the central canal. Bilateral foraminal stenosis that could affect either or both T9 nerves. Findings could certainly relate to regional back pain. No finding to suggest that this is anything other than a benign fracture. The T9-10 disc bulges minimally.   At T10, there is some edema within the right pedicle, probably either deriving from the facet arthropathy or indicating a stress phenomenon. No vertebral body compression fracture.   T10-11: Mild bulging of the disc towards the left. Bilateral facet degeneration. No compressive stenosis.   T11-12: No disc abnormality. Bilateral facet degeneration and hypertrophy, worse on the right. No compressive stenosis.   T12-L1: Mild bulging of the disc.  No compressive stenosis.   IMPRESSION: 1. Acute or subacute inferior endplate fracture at T9 with loss of height of 20%. No retropulsed bone. This could be a cause of regional back pain. 2. 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 Bilateral foraminal stenosis that could affect either or both T9 nerves. Findings could also relate to regional back pain. 3. Old healed superior endplate fracture at T12, unchanged since 2020. 4. Edema within the right pedicle at T10, probably either deriving from the facet arthropathy or indicating a stress phenomenon. 5. 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     Electronically Signed   By: Paulina Fusi M.D.   On: 08/02/2022 10:16  PATIENT SURVEYS:  FOTO 53/100 with target of 64   SCREENING FOR RED FLAGS: Bowel or bladder incontinence:  No Spinal tumors: No Cauda equina syndrome: No Compression fracture: No Abdominal aneurysm: No  COGNITION: Overall cognitive status: Within functional limits for tasks assessed     SENSATION: Numbness and tingling in bilateral feet that do not radiate  up to low back   MUSCLE LENGTH: Hamstrings: Right 90 deg; Left 90 deg Thomas test: Negative bilaterally   POSTURE: rounded shoulders and forward head  PALPATION:  No tenderness to palpation   UE ROM:     Active ROM Right eval Left eval  Shoulder flexion    Shoulder extension    Shoulder abduction    Shoulder adduction    Shoulder internal rotation    Shoulder external rotation    Elbow flexion    Elbow extension    Wrist flexion    Wrist extension    Wrist ulnar deviation    Wrist radial deviation    Wrist pronation    Wrist supination    (Blank rows = not tested)        UE MMT:   Active ROM Right eval Left eval  Shoulder flexion 4+ 4+  Shoulder extension    Shoulder abduction 4+ 4+  Shoulder adduction    Shoulder internal rotation 4 4  Shoulder external rotation 4 4  Mid Trap  NT  NT   Lower Trap  NT  NT  Elbow flexion    Elbow extension    Wrist flexion    Wrist extension    Wrist ulnar deviation    Wrist radial deviation    Wrist pronation    Wrist supination    (Blank rows = not tested)   LUMBAR ROM:   AROM eval  Flexion 100%  Extension 100%  Right lateral flexion 100%  Left lateral flexion 100%  Right rotation 100%  Left rotation 100%   (Blank rows = not tested)  LOWER EXTREMITY ROM:     Active  Right eval Left eval  Hip flexion    Hip extension    Hip abduction    Hip adduction    Hip internal rotation    Hip external rotation    Knee flexion    Knee extension    Ankle dorsiflexion    Ankle plantarflexion    Ankle inversion    Ankle eversion     (Blank rows = not tested)  LOWER EXTREMITY MMT:    MMT Right eval Left eval  Hip flexion 4 4  Hip extension 4 4   Hip abduction 4- 4-  Hip adduction 4- 4-  Hip internal rotation    Hip external rotation    Knee flexion 4 4  Knee extension 4 4  Ankle dorsiflexion 4 4  Ankle plantarflexion    Ankle inversion    Ankle eversion     (Blank rows = not tested)  LUMBAR SPECIAL TESTS:  Straight leg raise test: Negative, FABER test: Negative, Thomas test: Negative, and FADIR Negative   FUNCTIONAL TESTS:  None performed   GAIT: Distance walked: 50 ft  Assistive device utilized: None Level of assistance: Complete Independence Comments: Decreased stride length and step length bilaterally  TODAY'S TREATMENT:  DATE:   10/17/22:  Prone Quad Strength 2  x 30 sec  Sit to stand with #10 DB 3 x 10  -min VC to increase speed of stand and decrease speed of sit   PATIENT EDUCATION:  Education details: Form and technique for correct performance of exercise  Person educated: Patient Education method: Explanation, Demonstration, Verbal cues, and Handouts Education comprehension: verbalized understanding, returned demonstration, and verbal cues required  HOME EXERCISE PROGRAM: Access Code: [email or web address] URL: [email or web address] Date: 10/17/2022 Prepared by: Ellin Goodie  Exercises - Sit to Stand  - 3-4 x weekly - 3 sets - 10 reps - Prone Quadriceps Stretch with Strap  - 1 x daily - 3 reps - 30 sec  hold  ASSESSMENT:  CLINICAL IMPRESSION: Patient is a 89 y.o. white male  who was seen today for physical therapy evaluation and treatment for mid back pain in the setting of recent compression fracture of T9. PMH does include prior T12 compression fracture and lumbar stenosis. Unable to fully test all pain generating areas due to limited time. However, pt does show decreased hip strength and flexibility along with increased mid back pain with standing that are limiting his  ability to stand and walk for prolonged periods of time. He will benefit from skilled PT to address these aforementioned deficits to improve posture and ability to stand and walk for longer periods of time to continue to maintain independence and well being like walking dog and getting groceries.   OBJECTIVE IMPAIRMENTS: hypomobility, impaired flexibility, postural dysfunction, and pain.   ACTIVITY LIMITATIONS: carrying, lifting, standing, and squatting  PARTICIPATION LIMITATIONS: shopping, community activity, occupation, and yard work  PERSONAL FACTORS: Age, Past/current experiences, and Low back pain are also affecting patient's functional outcome.   REHAB POTENTIAL: Good  CLINICAL DECISION MAKING: Stable/uncomplicated  EVALUATION COMPLEXITY: Low   GOALS: Goals reviewed with patient? No  SHORT TERM GOALS: Target date: 10/31/2022  PT reviewed the following HEP with patient with patient able to demonstrate a set of the following with min cuing for correction needed. PT educated patient on parameters of therex (how/when to inc/decrease intensity, frequency, rep/set range, stretch hold time, and purpose of therex) with verbalized understanding.  Baseline: NT  Goal status: INITIAL    LONG TERM GOALS: Target date: 12/26/2022  Patient will have improved function and activity level as evidenced by an increase in FOTO score by 10 points or more.  Baseline: 53 with target of 63  Goal status: INITIAL  2.  Patient will demonstrate improved periscapular strength by (1/3 grade MMT i.e 4- to 4) as evidence of improved posture and for offloading of anterior portion of thoracic vertebrae to prevent compression fractures and improve thoracic function.  Baseline: NT  Goal status: INITIAL  3.  Patient will demonstrate a wall to occiput test value of <2.0 cm as evidence of decreased risk of vertebral compression fracture in the setting of kyphosis.  Baseline: NT  Goal status: INITIAL  4.   Patient will be able to carry out daily activities without >3/10 NRPS in thoracic spine as evidence of improve thoracic function.  Baseline: 4-5/10 NRPS Goal status: INITIAL    PLAN:  PT FREQUENCY: 1-2x/week  PT DURATION: 10 weeks  PLANNED INTERVENTIONS: Therapeutic exercises, Therapeutic activity, Neuromuscular re-education, Balance training, Gait training, Patient/Family education, Self Care, Joint mobilization, Joint manipulation, Aquatic Therapy, Dry Needling, Electrical stimulation, Spinal manipulation, Spinal mobilization, Cryotherapy, Moist heat, Traction, Manual therapy, and Re-evaluation.  PLAN FOR NEXT SESSION: Wall to Occiput Test,  Treadmill test for lumbar stenosis, assess UE ROM and periscapular strength and continue to progress hip and periscapular strength.    Ellin Goodie PT, DPT  [facility] [address], [place], [address] Phone: [phone]   Fax:  [phone]

## 2022-10-19 ENCOUNTER — Ambulatory Visit: Payer: Medicare Other | Admitting: Physical Therapy

## 2022-10-19 DIAGNOSIS — M546 Pain in thoracic spine: Secondary | ICD-10-CM

## 2022-10-19 DIAGNOSIS — G8929 Other chronic pain: Secondary | ICD-10-CM

## 2022-10-19 NOTE — Therapy (Signed)
OUTPATIENT PHYSICAL THERAPY THORACOLUMBAR TREATMENT   Patient Name: Patrick Wu MRN: [id number] DOB:05-07-1933, 89 y.o., male Today's Date: 10/19/2022  END OF SESSION:  PT End of Session - 10/19/22 1037     Visit Number 2    Number of Visits 20    Date for PT Re-Evaluation 12/26/22    Authorization Type Medicare 2024    Authorization - Visit Number 2    Authorization - Number of Visits 20    Progress Note Due on Visit 10    PT Start Time 1033    PT Stop Time 1115    PT Time Calculation (min) 42 min    Activity Tolerance Patient tolerated treatment well    Behavior During Therapy [facility] for tasks assessed/performed             Past Medical History:  Diagnosis Date   Asthma    Basal cell carcinoma 03/26/2019   Nodular. Glabella. Mohs 07/02/2019   Basal cell carcinoma 02/18/2021   R upper back, EDC   Basal cell carcinoma 08/26/2021   Right preauricular. Nodular and infiltrative. Mohs 12/29/2021   Past Surgical History:  Procedure Laterality Date   APPENDECTOMY     There are no problems to display for this patient.   PCP:   REFERRING PROVIDER: Dr. Cristopher Peru  REFERRING DIAG: [phone] (ICD-10-CM) - Femoral neuropathy of lower extremity, bilateral  Rationale for Evaluation and Treatment: Rehabilitation  THERAPY DIAG:  Pain in thoracic spine  Chronic pain of right knee  ONSET DATE: June 2024   SUBJECTIVE:                                                                                                                                                                                           SUBJECTIVE STATEMENT: Pt reports increased thoracic back pain yesterday. He is not sure why.   PERTINENT HISTORY:  Pt reports that he had a compression fracture in his thoracic spine (T9) back in early June. He has since had resulting neuropathic pain that radiates around to his chest. He has taken gabapentin for the pain which has helped some, but he now mostly takes it  at night. He experiences most of his pain during afternoon when standing for long periods of time. If he sits for 5 minutes, then the pain decreases. He describes having a history of a right sided TIA where he lost control of his right leg. Pt was referred for neuropathy but this is unrelated to his current mid back pain.   PAIN:  Are you having pain? Yes: NPRS scale: 4-5/10 Pain location: T9  Pain description: Dull and achy  Aggravating factors: Standing for long periods of time Relieving factors: Sitting down for long periods of time.   PRECAUTIONS: None  RED FLAGS: None   WEIGHT BEARING RESTRICTIONS: No  FALLS:  Has patient fallen in last 6 months? No  LIVING ENVIRONMENT: Lives with: lives with their spouse Lives in: House/apartment Stairs: No Has following equipment at home: None  OCCUPATION: Multimedia programmer and teaches classes for [employer]   PLOF: Independent  PATIENT GOALS: Decrease mid back pain and improve posture.   NEXT MD VISIT: November 2024   OBJECTIVE:  Note: Objective measures were completed at Evaluation unless otherwise noted.  VITALS: BP 167/82 HR 58 SpO2 98%   DIAGNOSTIC FINDINGS:  CLINICAL DATA:  Thoracic region back pain with radiation around the sides.   EXAM: MRI THORACIC SPINE WITHOUT CONTRAST   TECHNIQUE: Multiplanar, multisequence MR imaging of the thoracic spine was performed. No intravenous contrast was administered.   COMPARISON:  Chest CT 08/20/2018   FINDINGS: Alignment:  Mild scoliotic curvature.   Vertebrae: Acute or subacute inferior endplate fracture at T9 with loss of height of 20%. No retropulsed bone. Old healed superior endplate fracture at T12, unchanged since a chest CT in 2020.   Benign appearing hemangioma within the T7 vertebral body.   Cord:  No cord compression or focal cord lesion.   Paraspinal and other soft tissues: Negative except for a hiatal hernia.   Disc levels:   No significant finding from T1-2  through T5-6.   T6-7: Small central disc protrusion indents the thecal sac slightly but does not cause any neural compression.   T7-8 and T8-9: No disc abnormality or significant stenosis. 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   Inferior endplate compression fracture at T9 as noted above with marrow edema. Bilateral facet joint effusions and posterior element edema. No compressive stenosis of the central canal. Bilateral foraminal stenosis that could affect either or both T9 nerves. Findings could certainly relate to regional back pain. No finding to suggest that this is anything other than a benign fracture. The T9-10 disc bulges minimally.   At T10, there is some edema within the right pedicle, probably either deriving from the facet arthropathy or indicating a stress phenomenon. No vertebral body compression fracture.   T10-11: Mild bulging of the disc towards the left. Bilateral facet degeneration. No compressive stenosis.   T11-12: No disc abnormality. Bilateral facet degeneration and hypertrophy, worse on the right. No compressive stenosis.   T12-L1: Mild bulging of the disc.  No compressive stenosis.   IMPRESSION: 1. Acute or subacute inferior endplate fracture at T9 with loss of height of 20%. No retropulsed bone. This could be a cause of regional back pain. 2. 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 Bilateral foraminal stenosis that could affect either or both T9 nerves. Findings could also relate to regional back pain. 3. Old healed superior endplate fracture at T12, unchanged since 2020. 4. Edema within the right pedicle at T10, probably either deriving from the facet arthropathy or indicating a stress phenomenon. 5. 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     Electronically Signed   By: Paulina Fusi M.D.   On: 08/02/2022 10:16  PATIENT SURVEYS:  FOTO 53/100 with target  of 64   SCREENING FOR RED FLAGS: Bowel or bladder incontinence: No Spinal tumors: No Cauda equina syndrome: No Compression fracture: No Abdominal aneurysm: No  COGNITION: Overall cognitive status: Within functional limits for tasks assessed  SENSATION: Numbness and tingling in bilateral feet that do not radiate up to low back   MUSCLE LENGTH: Hamstrings: Right 90 deg; Left 90 deg Thomas test: Negative bilaterally   POSTURE: rounded shoulders and forward head  PALPATION:  No tenderness to palpation   UE ROM:     Active ROM Right eval Left eval  Shoulder flexion    Shoulder extension    Shoulder abduction    Shoulder adduction    Shoulder internal rotation    Shoulder external rotation    Elbow flexion    Elbow extension    Wrist flexion    Wrist extension    Wrist ulnar deviation    Wrist radial deviation    Wrist pronation    Wrist supination    (Blank rows = not tested)        UE MMT:   Active ROM Right eval Left eval  Shoulder flexion 4+ 4+  Shoulder extension    Shoulder abduction 4+ 4+  Shoulder adduction    Shoulder internal rotation 4 4  Shoulder external rotation 4 4  Mid Trap  NT  NT   Lower Trap  NT  NT  Elbow flexion    Elbow extension    Wrist flexion    Wrist extension    Wrist ulnar deviation    Wrist radial deviation    Wrist pronation    Wrist supination    (Blank rows = not tested)   LUMBAR ROM:   AROM eval  Flexion 100%  Extension 100%  Right lateral flexion 100%  Left lateral flexion 100%  Right rotation 100%  Left rotation 100%   (Blank rows = not tested)  LOWER EXTREMITY ROM:     Active  Right eval Left eval  Hip flexion    Hip extension    Hip abduction    Hip adduction    Hip internal rotation    Hip external rotation    Knee flexion    Knee extension    Ankle dorsiflexion    Ankle plantarflexion    Ankle inversion    Ankle eversion     (Blank rows = not tested)  LOWER EXTREMITY MMT:     MMT Right eval Left eval  Hip flexion 4 4  Hip extension 4 4  Hip abduction 4- 4-  Hip adduction 4- 4-  Hip internal rotation    Hip external rotation    Knee flexion 4 4  Knee extension 4 4  Ankle dorsiflexion 4 4  Ankle plantarflexion    Ankle inversion    Ankle eversion     (Blank rows = not tested)  LUMBAR SPECIAL TESTS:  Straight leg raise test: Negative, FABER test: Negative, Thomas test: Negative, and FADIR Negative   FUNCTIONAL TESTS:  None performed   GAIT: Distance walked: 50 ft  Assistive device utilized: None Level of assistance: Complete Independence Comments: Decreased stride length and step length bilaterally  TODAY'S TREATMENT:  DATE:   10/19/22: Nu-step with seat at 9 and resistance at 3 for 5 min Wall Occiput Test: 8 inch   Periscapular MMT: -Mid Trap R/L 4/4 -Rhomboid R/L 4/4 -Lower Trap R/L 4-/4- -Shoulder Ext R/L 4/4  Standing Hip Ext with forward lean  1 x 10 Standing Hip Ext without forward lean 1 x 10  Quadruped with hip extension 1 x 10  OMEGA Seated Rows #20 1 x 10  OMEGA Seated Rows #25 1 x 10   OMEGA Seated Rows #35 1 x 10  D2 Flexion/Extension with yellow band 1 x 10   10/17/22:  Prone Quad Strength 2  x 30 sec  Sit to stand with #10 DB 3 x 10  -min VC to increase speed of stand and decrease speed of sit   PATIENT EDUCATION:  Education details: Form and technique for correct performance of exercise  Person educated: Patient Education method: Explanation, Demonstration, Verbal cues, and Handouts Education comprehension: verbalized understanding, returned demonstration, and verbal cues required  HOME EXERCISE PROGRAM: Access Code: [email or web address] URL: [email or web address] Date: 10/19/2022 Prepared by: Ellin Goodie  Exercises - Seated Row Cable Machine  - 3-4 x weekly - 3 sets - 10 reps -  Beginner Front Arm Support  - 3-4 x weekly - 3 sets - 10 reps - Standing Shoulder Single Arm PNF D2 Flexion with Resistance  - 3-4 x weekly - 3 sets - 10 reps  ASSESSMENT:  CLINICAL IMPRESSION: Pt able to perform all exercises without an increase in his mid back pain. Modified hep to exclude initial exercises to avoid further pain exacerbation. He does show kyphosis along with decreased periscapular strength. He will continue to benefit from skilled PT to address these aforementioned deficits to improve posture and ability to stand and walk for longer periods of time to continue to maintain independence and well being like walking dog and getting groceries.   OBJECTIVE IMPAIRMENTS: hypomobility, impaired flexibility, postural dysfunction, and pain.   ACTIVITY LIMITATIONS: carrying, lifting, standing, and squatting  PARTICIPATION LIMITATIONS: shopping, community activity, occupation, and yard work  PERSONAL FACTORS: Age, Past/current experiences, and Low back pain are also affecting patient's functional outcome.   REHAB POTENTIAL: Good  CLINICAL DECISION MAKING: Stable/uncomplicated  EVALUATION COMPLEXITY: Low   GOALS: Goals reviewed with patient? No  SHORT TERM GOALS: Target date: 10/31/2022  PT reviewed the following HEP with patient with patient able to demonstrate a set of the following with min cuing for correction needed. PT educated patient on parameters of therex (how/when to inc/decrease intensity, frequency, rep/set range, stretch hold time, and purpose of therex) with verbalized understanding.  Baseline: NT 10/19/22 Performing independently  Goal status: ACHIEVED     LONG TERM GOALS: Target date: 12/26/2022  Patient will have improved function and activity level as evidenced by an increase in FOTO score by 10 points or more.  Baseline: 53 with target of 63  Goal status: ONGOING   2.  Patient will demonstrate improved periscapular strength by (1/3 grade MMT i.e 4- to  4) as evidence of improved posture and for offloading of anterior portion of thoracic vertebrae to prevent compression fractures and improve thoracic function.  Baseline: Mid Trap R/L 4/4, Rhomboid R/L 4/4, Lower Trap R/L 4-/4- , Shoulder Ext R/L 4/4  Goal status: ONGOING   3.  Patient will demonstrate a wall to occiput test value of <2.0 cm as evidence of decreased risk of vertebral compression fracture in the setting of kyphosis.  Baseline: 8 inch  Goal  status: ONGOING   4.  Patient will be able to carry out daily activities without >3/10 NRPS in thoracic spine as evidence of improve thoracic function.  Baseline: 4-5/10 NRPS Goal status: ONGOING     PLAN:  PT FREQUENCY: 1-2x/week  PT DURATION: 10 weeks  PLANNED INTERVENTIONS: Therapeutic exercises, Therapeutic activity, Neuromuscular re-education, Balance training, Gait training, Patient/Family education, Self Care, Joint mobilization, Joint manipulation, Aquatic Therapy, Dry Needling, Electrical stimulation, Spinal manipulation, Spinal mobilization, Cryotherapy, Moist heat, Traction, Manual therapy, and Re-evaluation.  PLAN FOR NEXT SESSION: Wall to Occiput Test, Treadmill test for lumbar stenosis, assess UE ROM and periscapular strength and continue to progress hip and periscapular strength: Horizontal abduction T's and Lat Pull Down and Bird Dogs    Ellin Goodie PT, DPT  [facility] [address] [place], [address] Phone: [phone]   Fax:  [phone]

## 2022-10-27 ENCOUNTER — Ambulatory Visit: Payer: Medicare Other | Admitting: Physical Therapy

## 2022-10-27 ENCOUNTER — Ambulatory Visit: Payer: Medicare Other | Admitting: Dermatology

## 2022-10-27 DIAGNOSIS — M546 Pain in thoracic spine: Secondary | ICD-10-CM | POA: Diagnosis not present

## 2022-10-27 DIAGNOSIS — G8929 Other chronic pain: Secondary | ICD-10-CM

## 2022-10-27 NOTE — Therapy (Signed)
OUTPATIENT PHYSICAL THERAPY THORACOLUMBAR TREATMENT   Patient Name: Patrick Wu MRN: [id number] DOB:[birth year], 89 y.o., male Today's Date: 10/27/2022  END OF SESSION:  PT End of Session - 10/27/22 0914     Visit Number 3    Number of Visits 20    Date for PT Re-Evaluation 12/26/22    Authorization Type Medicare 2024    Authorization - Visit Number 3    Authorization - Number of Visits 20    Progress Note Due on Visit 10    PT Start Time 0905    PT Stop Time 0945    PT Time Calculation (min) 40 min    Activity Tolerance Patient tolerated treatment well    Behavior During Therapy [facility] for tasks assessed/performed              Past Medical History:  Diagnosis Date   Asthma    Basal cell carcinoma 03/26/2019   Nodular. Glabella. Mohs 07/02/2019   Basal cell carcinoma 02/18/2021   R upper back, EDC   Basal cell carcinoma 08/26/2021   Right preauricular. Nodular and infiltrative. Mohs 12/29/2021   Past Surgical History:  Procedure Laterality Date   APPENDECTOMY     There are no problems to display for this patient.   PCP:   REFERRING PROVIDER: Dr. Cristopher Peru  REFERRING DIAG: [phone] (ICD-10-CM) - Femoral neuropathy of lower extremity, bilateral  Rationale for Evaluation and Treatment: Rehabilitation  THERAPY DIAG:  Pain in thoracic spine  Chronic pain of right knee  ONSET DATE: June 2024   SUBJECTIVE:                                                                                                                                                                                           SUBJECTIVE STATEMENT: Pt states that he continues to feel thoracic back pain with activity that hurts only after performing exercises later in the evening.   PERTINENT HISTORY:  Pt reports that he had a compression fracture in his thoracic spine (T9) back in early June. He has since had resulting neuropathic pain that radiates around to his chest. He has taken  gabapentin for the pain which has helped some, but he now mostly takes it at night. He experiences most of his pain during afternoon when standing for long periods of time. If he sits for 5 minutes, then the pain decreases. He describes having a history of a right sided TIA where he lost control of his right leg. Pt was referred for neuropathy but this is unrelated to his current mid back pain.   PAIN:  Are you having pain? Yes: NPRS  scale: 4-5/10 Pain location: T9  Pain description: Dull and achy  Aggravating factors: Standing for long periods of time Relieving factors: Sitting down for long periods of time.   PRECAUTIONS: None  RED FLAGS: None   WEIGHT BEARING RESTRICTIONS: No  FALLS:  Has patient fallen in last 6 months? No  LIVING ENVIRONMENT: Lives with: lives with their spouse Lives in: House/apartment Stairs: No Has following equipment at home: None  OCCUPATION: Multimedia programmer and teaches classes for [employer]   PLOF: Independent  PATIENT GOALS: Decrease mid back pain and improve posture.   NEXT MD VISIT: November 2024   OBJECTIVE:  Note: Objective measures were completed at Evaluation unless otherwise noted.  VITALS: BP 167/82 HR 58 SpO2 98%   DIAGNOSTIC FINDINGS:  CLINICAL DATA:  Thoracic region back pain with radiation around the sides.   EXAM: MRI THORACIC SPINE WITHOUT CONTRAST   TECHNIQUE: Multiplanar, multisequence MR imaging of the thoracic spine was performed. No intravenous contrast was administered.   COMPARISON:  Chest CT 08/20/2018   FINDINGS: Alignment:  Mild scoliotic curvature.   Vertebrae: Acute or subacute inferior endplate fracture at T9 with loss of height of 20%. No retropulsed bone. Old healed superior endplate fracture at T12, unchanged since a chest CT in 2020.   Benign appearing hemangioma within the T7 vertebral body.   Cord:  No cord compression or focal cord lesion.   Paraspinal and other soft tissues: Negative except for a  hiatal hernia.   Disc levels:   No significant finding from T1-2 through T5-6.   T6-7: Small central disc protrusion indents the thecal sac slightly but does not cause any neural compression.   T7-8 and T8-9: No disc abnormality or significant stenosis. 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   Inferior endplate compression fracture at T9 as noted above with marrow edema. Bilateral facet joint effusions and posterior element edema. No compressive stenosis of the central canal. Bilateral foraminal stenosis that could affect either or both T9 nerves. Findings could certainly relate to regional back pain. No finding to suggest that this is anything other than a benign fracture. The T9-10 disc bulges minimally.   At T10, there is some edema within the right pedicle, probably either deriving from the facet arthropathy or indicating a stress phenomenon. No vertebral body compression fracture.   T10-11: Mild bulging of the disc towards the left. Bilateral facet degeneration. No compressive stenosis.   T11-12: No disc abnormality. Bilateral facet degeneration and hypertrophy, worse on the right. No compressive stenosis.   T12-L1: Mild bulging of the disc.  No compressive stenosis.   IMPRESSION: 1. Acute or subacute inferior endplate fracture at T9 with loss of height of 20%. No retropulsed bone. This could be a cause of regional back pain. 2. 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 Bilateral foraminal stenosis that could affect either or both T9 nerves. Findings could also relate to regional back pain. 3. Old healed superior endplate fracture at T12, unchanged since 2020. 4. Edema within the right pedicle at T10, probably either deriving from the facet arthropathy or indicating a stress phenomenon. 5. 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     Electronically Signed   By: Paulina Fusi  M.D.   On: 08/02/2022 10:16  PATIENT SURVEYS:  FOTO 53/100 with target of 64   SCREENING FOR RED FLAGS: Bowel or bladder incontinence: No Spinal tumors: No Cauda equina syndrome: No Compression fracture: No Abdominal aneurysm: No  COGNITION: Overall cognitive status: Within functional limits for tasks assessed     SENSATION: Numbness and tingling in bilateral feet that do not radiate up to low back   MUSCLE LENGTH: Hamstrings: Right 90 deg; Left 90 deg Thomas test: Negative bilaterally   POSTURE: rounded shoulders and forward head  PALPATION:  No tenderness to palpation   UE ROM:     Active ROM Right eval Left eval  Shoulder flexion    Shoulder extension    Shoulder abduction    Shoulder adduction    Shoulder internal rotation    Shoulder external rotation    Elbow flexion    Elbow extension    Wrist flexion    Wrist extension    Wrist ulnar deviation    Wrist radial deviation    Wrist pronation    Wrist supination    (Blank rows = not tested)        UE MMT:   Active ROM Right eval Left eval  Shoulder flexion 4+ 4+  Shoulder extension    Shoulder abduction 4+ 4+  Shoulder adduction    Shoulder internal rotation 4 4  Shoulder external rotation 4 4  Mid Trap  NT  NT   Lower Trap  NT  NT  Elbow flexion    Elbow extension    Wrist flexion    Wrist extension    Wrist ulnar deviation    Wrist radial deviation    Wrist pronation    Wrist supination    (Blank rows = not tested)   LUMBAR ROM:   AROM eval  Flexion 100%  Extension 100%  Right lateral flexion 100%  Left lateral flexion 100%  Right rotation 100%  Left rotation 100%   (Blank rows = not tested)  LOWER EXTREMITY ROM:     Active  Right eval Left eval  Hip flexion    Hip extension    Hip abduction    Hip adduction    Hip internal rotation    Hip external rotation    Knee flexion    Knee extension    Ankle dorsiflexion    Ankle plantarflexion    Ankle inversion     Ankle eversion     (Blank rows = not tested)  LOWER EXTREMITY MMT:    MMT Right eval Left eval  Hip flexion 4 4  Hip extension 4 4  Hip abduction 4- 4-  Hip adduction 4- 4-  Hip internal rotation    Hip external rotation    Knee flexion 4 4  Knee extension 4 4  Ankle dorsiflexion 4 4  Ankle plantarflexion    Ankle inversion    Ankle eversion     (Blank rows = not tested)  LUMBAR SPECIAL TESTS:  Straight leg raise test: Negative, FABER test: Negative, Thomas test: Negative, and FADIR Negative   FUNCTIONAL TESTS:  None performed   GAIT: Distance walked: 50 ft  Assistive device utilized: None Level of assistance: Complete Independence Comments: Decreased stride length and step length bilaterally  TODAY'S TREATMENT:  DATE:   10/27/22: TM at 1.5 mph at 0 incline for 4 min  TM at 1.5 mph at 4 incline for 3 min  Shoulder Horizontal Abduction AROM 1 x 10  Shoulder Horizontal Abduction yellow band 2 x 10  Hip Height Difference: L PSIS >R PSIS  Right Lateral Shift 1 x 10 with 5 sec hold  Wall Push Up with Plus 1 x 10  Lat Pull Down with Green TB 1 x 10     10/19/22: Nu-step with seat at 9 and resistance at 3 for 5 min Wall Occiput Test: 8 inch   Periscapular MMT: -Mid Trap R/L 4/4 -Rhomboid R/L 4/4 -Lower Trap R/L 4-/4- -Shoulder Ext R/L 4/4  Standing Hip Ext with forward lean  1 x 10 Standing Hip Ext without forward lean 1 x 10  Quadruped with hip extension 1 x 10  OMEGA Seated Rows #20 1 x 10  OMEGA Seated Rows #25 1 x 10   OMEGA Seated Rows #35 1 x 10  D2 Flexion/Extension with yellow band 1 x 10   10/17/22:  Prone Quad Strength 2  x 30 sec  Sit to stand with #10 DB 3 x 10  -min VC to increase speed of stand and decrease speed of sit   PATIENT EDUCATION:  Education details: Form and technique for correct performance of exercise   Person educated: Patient Education method: Explanation, Demonstration, Verbal cues, and Handouts Education comprehension: verbalized understanding, returned demonstration, and verbal cues required  HOME EXERCISE PROGRAM: Access Code: [email or web address] URL: [email or web address] Date: 10/19/2022 Prepared by: Ellin Goodie  Exercises - Seated Row Cable Machine  - 3-4 x weekly - 3 sets - 10 reps - Beginner Front Arm Support  - 3-4 x weekly - 3 sets - 10 reps - Standing Shoulder Single Arm PNF D2 Flexion with Resistance  - 3-4 x weekly - 3 sets - 10 reps  ASSESSMENT:  CLINICAL IMPRESSION: Pt demonstrates continued activity tolerance with ability to perform all periscapular exercises without an increase in his thoracic back pain even after exercise sessions. Pt has levoscoliosis with left pelvic obliquity as evidenced by difference in PSIS height. He will continue to benefit from skilled PT to address these aforementioned deficits to improve posture and ability to stand and walk for longer periods of time to continue to maintain independence and well being like walking dog and getting groceries.   OBJECTIVE IMPAIRMENTS: hypomobility, impaired flexibility, postural dysfunction, and pain.   ACTIVITY LIMITATIONS: carrying, lifting, standing, and squatting  PARTICIPATION LIMITATIONS: shopping, community activity, occupation, and yard work  PERSONAL FACTORS: Age, Past/current experiences, and Low back pain are also affecting patient's functional outcome.   REHAB POTENTIAL: Good  CLINICAL DECISION MAKING: Stable/uncomplicated  EVALUATION COMPLEXITY: Low   GOALS: Goals reviewed with patient? No  SHORT TERM GOALS: Target date: 10/31/2022  PT reviewed the following HEP with patient with patient able to demonstrate a set of the following with min cuing for correction needed. PT educated patient on parameters of therex (how/when to inc/decrease intensity, frequency, rep/set range,  stretch hold time, and purpose of therex) with verbalized understanding.  Baseline: NT 10/19/22 Performing independently  Goal status: ACHIEVED     LONG TERM GOALS: Target date: 12/26/2022  Patient will have improved function and activity level as evidenced by an increase in FOTO score by 10 points or more.  Baseline: 53 with target of 63  Goal status: ONGOING   2.  Patient will demonstrate improved periscapular strength by (1/3 grade  MMT i.e 4- to 4) as evidence of improved posture and for offloading of anterior portion of thoracic vertebrae to prevent compression fractures and improve thoracic function.  Baseline: Mid Trap R/L 4/4, Rhomboid R/L 4/4, Lower Trap R/L 4-/4- , Shoulder Ext R/L 4/4  Goal status: ONGOING   3.  Patient will demonstrate a wall to occiput test value of <2.0 cm as evidence of decreased risk of vertebral compression fracture in the setting of kyphosis.  Baseline: 8 inch  Goal status: ONGOING   4.  Patient will be able to carry out daily activities without >3/10 NRPS in thoracic spine as evidence of improve thoracic function.  Baseline: 4-5/10 NRPS Goal status: ONGOING     PLAN:  PT FREQUENCY: 1-2x/week  PT DURATION: 10 weeks  PLANNED INTERVENTIONS: Therapeutic exercises, Therapeutic activity, Neuromuscular re-education, Balance training, Gait training, Patient/Family education, Self Care, Joint mobilization, Joint manipulation, Aquatic Therapy, Dry Needling, Electrical stimulation, Spinal manipulation, Spinal mobilization, Cryotherapy, Moist heat, Traction, Manual therapy, and Re-evaluation.  PLAN FOR NEXT SESSION Lateral shift dosage. Assess UE ROM and periscapular strength and continue to progress hip and periscapular strength: Bird dogs, Lat Pull Downs, Doorway Stretch     Ellin Goodie PT, DPT  [facility], [facility] [address], [place], [address] Phone: [phone]   Fax:  [phone]

## 2022-10-31 ENCOUNTER — Ambulatory Visit: Payer: Medicare Other | Admitting: Physical Therapy

## 2022-10-31 ENCOUNTER — Encounter: Payer: Self-pay | Admitting: Physical Therapy

## 2022-10-31 DIAGNOSIS — G8929 Other chronic pain: Secondary | ICD-10-CM

## 2022-10-31 DIAGNOSIS — M546 Pain in thoracic spine: Secondary | ICD-10-CM

## 2022-10-31 NOTE — Therapy (Signed)
OUTPATIENT PHYSICAL THERAPY THORACOLUMBAR TREATMENT   Patient Name: Patrick Wu MRN: [id number] DOB:1933/03/18, 89 y.o., male Today's Date: 10/31/2022  END OF SESSION:  PT End of Session - 10/31/22 0949     Visit Number 4    Number of Visits 20    Date for PT Re-Evaluation 12/26/22    Authorization Type Medicare 2024    Authorization - Visit Number 4    Authorization - Number of Visits 20    Progress Note Due on Visit 10    PT Start Time 0945    PT Stop Time 1030    PT Time Calculation (min) 45 min    Activity Tolerance Patient tolerated treatment well    Behavior During Therapy [facility] for tasks assessed/performed              Past Medical History:  Diagnosis Date   Asthma    Basal cell carcinoma 03/26/2019   Nodular. Glabella. Mohs 07/02/2019   Basal cell carcinoma 02/18/2021   R upper back, EDC   Basal cell carcinoma 08/26/2021   Right preauricular. Nodular and infiltrative. Mohs 12/29/2021   Past Surgical History:  Procedure Laterality Date   APPENDECTOMY     There are no problems to display for this patient.   PCP:   REFERRING PROVIDER: Dr. Cristopher Peru  REFERRING DIAG: [id number] (ICD-10-CM) - Femoral neuropathy of lower extremity, bilateral  Rationale for Evaluation and Treatment: Rehabilitation  THERAPY DIAG:  Pain in thoracic spine  Chronic pain of right knee  ONSET DATE: June 2024   SUBJECTIVE:                                                                                                                                                                                           SUBJECTIVE STATEMENT: Pt is still awaiting information on his EMG from neurologist. He is still having pain at night in his thoracic spine and he uses Tylenol to treat it.   PERTINENT HISTORY:  Pt reports that he had a compression fracture in his thoracic spine (T9) back in early June. He has since had resulting neuropathic pain that radiates around to his chest. He has  taken gabapentin for the pain which has helped some, but he now mostly takes it at night. He experiences most of his pain during afternoon when standing for long periods of time. If he sits for 5 minutes, then the pain decreases. He describes having a history of a right sided TIA where he lost control of his right leg. Pt was referred for neuropathy but this is unrelated to his current mid back pain.   PAIN:  Are you having pain? Yes: NPRS scale: 4-5/10 Pain location: T9  Pain description: Dull and achy  Aggravating factors: Standing for long periods of time Relieving factors: Sitting down for long periods of time.   PRECAUTIONS: None  RED FLAGS: None   WEIGHT BEARING RESTRICTIONS: No  FALLS:  Has patient fallen in last 6 months? No  LIVING ENVIRONMENT: Lives with: lives with their spouse Lives in: House/apartment Stairs: No Has following equipment at home: None  OCCUPATION: Multimedia programmer and teaches classes for [employer]   PLOF: Independent  PATIENT GOALS: Decrease mid back pain and improve posture.   NEXT MD VISIT: November 2024   OBJECTIVE:  Note: Objective measures were completed at Evaluation unless otherwise noted.  VITALS: BP 167/82 HR 58 SpO2 98%   DIAGNOSTIC FINDINGS:  CLINICAL DATA:  Thoracic region back pain with radiation around the sides.   EXAM: MRI THORACIC SPINE WITHOUT CONTRAST   TECHNIQUE: Multiplanar, multisequence MR imaging of the thoracic spine was performed. No intravenous contrast was administered.   COMPARISON:  Chest CT 08/20/2018   FINDINGS: Alignment:  Mild scoliotic curvature.   Vertebrae: Acute or subacute inferior endplate fracture at T9 with loss of height of 20%. No retropulsed bone. Old healed superior endplate fracture at T12, unchanged since a chest CT in 2020.   Benign appearing hemangioma within the T7 vertebral body.   Cord:  No cord compression or focal cord lesion.   Paraspinal and other soft tissues: Negative except  for a hiatal hernia.   Disc levels:   No significant finding from T1-2 through T5-6.   T6-7: Small central disc protrusion indents the thecal sac slightly but does not cause any neural compression.   T7-8 and T8-9: No disc abnormality or significant stenosis. 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   Inferior endplate compression fracture at T9 as noted above with marrow edema. Bilateral facet joint effusions and posterior element edema. No compressive stenosis of the central canal. Bilateral foraminal stenosis that could affect either or both T9 nerves. Findings could certainly relate to regional back pain. No finding to suggest that this is anything other than a benign fracture. The T9-10 disc bulges minimally.   At T10, there is some edema within the right pedicle, probably either deriving from the facet arthropathy or indicating a stress phenomenon. No vertebral body compression fracture.   T10-11: Mild bulging of the disc towards the left. Bilateral facet degeneration. No compressive stenosis.   T11-12: No disc abnormality. Bilateral facet degeneration and hypertrophy, worse on the right. No compressive stenosis.   T12-L1: Mild bulging of the disc.  No compressive stenosis.   IMPRESSION: 1. Acute or subacute inferior endplate fracture at T9 with loss of height of 20%. No retropulsed bone. This could be a cause of regional back pain. 2. 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 Bilateral foraminal stenosis that could affect either or both T9 nerves. Findings could also relate to regional back pain. 3. Old healed superior endplate fracture at T12, unchanged since 2020. 4. Edema within the right pedicle at T10, probably either deriving from the facet arthropathy or indicating a stress phenomenon. 5. 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     Electronically Signed   By: Paulina Fusi M.D.   On: 08/02/2022 10:16  PATIENT SURVEYS:  FOTO 53/100 with target of 64   SCREENING FOR RED FLAGS: Bowel or bladder incontinence: No Spinal tumors: No Cauda equina syndrome: No  Compression fracture: No Abdominal aneurysm: No  COGNITION: Overall cognitive status: Within functional limits for tasks assessed     SENSATION: Numbness and tingling in bilateral feet that do not radiate up to low back   MUSCLE LENGTH: Hamstrings: Right 90 deg; Left 90 deg Thomas test: Negative bilaterally   POSTURE: rounded shoulders and forward head  PALPATION:  No tenderness to palpation   UE ROM:     Active ROM Right eval Left eval  Shoulder flexion    Shoulder extension    Shoulder abduction    Shoulder adduction    Shoulder internal rotation    Shoulder external rotation    Elbow flexion    Elbow extension    Wrist flexion    Wrist extension    Wrist ulnar deviation    Wrist radial deviation    Wrist pronation    Wrist supination    (Blank rows = not tested)        UE MMT:   Active ROM Right eval Left eval  Shoulder flexion 4+ 4+  Shoulder extension    Shoulder abduction 4+ 4+  Shoulder adduction    Shoulder internal rotation 4 4  Shoulder external rotation 4 4  Mid Trap  NT  NT   Lower Trap  NT  NT  Elbow flexion    Elbow extension    Wrist flexion    Wrist extension    Wrist ulnar deviation    Wrist radial deviation    Wrist pronation    Wrist supination    (Blank rows = not tested)   LUMBAR ROM:   AROM eval  Flexion 100%  Extension 100%  Right lateral flexion 100%  Left lateral flexion 100%  Right rotation 100%  Left rotation 100%   (Blank rows = not tested)  LOWER EXTREMITY ROM:     Active  Right eval Left eval  Hip flexion    Hip extension    Hip abduction    Hip adduction    Hip internal rotation    Hip external rotation    Knee flexion    Knee extension    Ankle dorsiflexion    Ankle plantarflexion    Ankle  inversion    Ankle eversion     (Blank rows = not tested)  LOWER EXTREMITY MMT:    MMT Right eval Left eval  Hip flexion 4 4  Hip extension 4 4  Hip abduction 4- 4-  Hip adduction 4- 4-  Hip internal rotation    Hip external rotation    Knee flexion 4 4  Knee extension 4 4  Ankle dorsiflexion 4 4  Ankle plantarflexion    Ankle inversion    Ankle eversion     (Blank rows = not tested)  LUMBAR SPECIAL TESTS:  Straight leg raise test: Negative, FABER test: Negative, Thomas test: Negative, and FADIR Negative   FUNCTIONAL TESTS:  None performed   GAIT: Distance walked: 50 ft  Assistive device utilized: None Level of assistance: Complete Independence Comments: Decreased stride length and step length bilaterally  TODAY'S TREATMENT:  DATE:   10/31/22: TM at 2.3 mph for 6 min  OMEGA Lat Pull Down Machine #20 1 x 10  OMEGA Lat Pull Down Machine #35 1 x 10  OMEGA Lat Pull Down Machine #45  1 x 10  Corner Pec Stretch 2 x 30 sec  Serratus Wall Slides with pillow case 1 x 10  Bent over horizontal abduction #1 1 x 10  Bent over horizontal abduction #2 1 x 10  Bent over horizontal abduction #3 1 x 10  Bird Dog Dogs 3 x 10   10/27/22: TM at 1.5 mph at 0 incline for 4 min  TM at 1.5 mph at 4 incline for 3 min  Shoulder Horizontal Abduction AROM 1 x 10  Shoulder Horizontal Abduction yellow band 2 x 10  Hip Height Difference: L PSIS >R PSIS  Right Lateral Shift 1 x 10 with 5 sec hold  Wall Push Up with Plus 1 x 10  Lat Pull Down with Green TB 1 x 10     10/19/22: Nu-step with seat at 9 and resistance at 3 for 5 min Wall Occiput Test: 8 inch   Periscapular MMT: -Mid Trap R/L 4/4 -Rhomboid R/L 4/4 -Lower Trap R/L 4-/4- -Shoulder Ext R/L 4/4  Standing Hip Ext with forward lean  1 x 10 Standing Hip Ext without forward lean 1 x 10  Quadruped with  hip extension 1 x 10  OMEGA Seated Rows #20 1 x 10  OMEGA Seated Rows #25 1 x 10   OMEGA Seated Rows #35 1 x 10  D2 Flexion/Extension with yellow band 1 x 10   PATIENT EDUCATION:  Education details: Form and technique for correct performance of exercise  Person educated: Patient Education method: Explanation, Demonstration, Verbal cues, and Handouts Education comprehension: verbalized understanding, returned demonstration, and verbal cues required  HOME EXERCISE PROGRAM: Access Code: [email or web address] URL: [email or web address] Date: 10/31/2022 Prepared by: Ellin Goodie  Exercises - Doorway Pec Stretch at 60 Elevation  - 1 x daily - 3 reps - 30-60 sec hold - Serratus Activation at Wall  - 3-4 x weekly - 3 sets - 10 reps - Right Standing Lateral Shift Correction at Wall - Repetitions  - 1 x daily - 10 reps - 5 sec  hold - Bird Dog  - 3-4 x weekly - 3 sets - 10 reps - Standing Shoulder Single Arm PNF D2 Flexion with Resistance  - 3-4 x weekly - 3 sets - 10 reps - Single Arm Bent Over Shoulder Horizontal Abduction with Dumbbell - Palm Down  - 3-4 x weekly - 3 sets - 10 reps - Seated Row Cable Machine  - 3-4 x weekly - 3 sets - 10 reps - Lat Pull Down  - 3-4 x weekly - 3 sets - 10 reps  ASSESSMENT:  CLINICAL IMPRESSION: Pt continues to demonstrate progress with periscapular and lumbar strength with ability to perform increased resistance with serratus wall slides and bird dog with little compensations. He continues to experience increased thoracic pain despite strength gains. He will continue to benefit from skilled PT to address these aforementioned deficits to improve posture and ability to stand and walk for longer periods of time to continue to maintain independence and well being like walking dog and getting groceries.   OBJECTIVE IMPAIRMENTS: hypomobility, impaired flexibility, postural dysfunction, and pain.   ACTIVITY LIMITATIONS: carrying, lifting, standing, and  squatting  PARTICIPATION LIMITATIONS: shopping, community activity, occupation, and yard work  PERSONAL FACTORS: Age, Past/current experiences, and Low  back pain are also affecting patient's functional outcome.   REHAB POTENTIAL: Good  CLINICAL DECISION MAKING: Stable/uncomplicated  EVALUATION COMPLEXITY: Low   GOALS: Goals reviewed with patient? No  SHORT TERM GOALS: Target date: 10/31/2022  PT reviewed the following HEP with patient with patient able to demonstrate a set of the following with min cuing for correction needed. PT educated patient on parameters of therex (how/when to inc/decrease intensity, frequency, rep/set range, stretch hold time, and purpose of therex) with verbalized understanding.  Baseline: NT 10/19/22 Performing independently  Goal status: ACHIEVED     LONG TERM GOALS: Target date: 12/26/2022  Patient will have improved function and activity level as evidenced by an increase in FOTO score by 10 points or more.  Baseline: 53 with target of 63  Goal status: ONGOING   2.  Patient will demonstrate improved periscapular strength by (1/3 grade MMT i.e 4- to 4) as evidence of improved posture and for offloading of anterior portion of thoracic vertebrae to prevent compression fractures and improve thoracic function.  Baseline: Mid Trap R/L 4/4, Rhomboid R/L 4/4, Lower Trap R/L 4-/4- , Shoulder Ext R/L 4/4  Goal status: ONGOING   3.  Patient will demonstrate a wall to occiput test value of <2.0 cm as evidence of decreased risk of vertebral compression fracture in the setting of kyphosis.  Baseline: 8 inch  Goal status: ONGOING   4.  Patient will be able to carry out daily activities without >3/10 NRPS in thoracic spine as evidence of improve thoracic function.  Baseline: 4-5/10 NRPS Goal status: ONGOING     PLAN:  PT FREQUENCY: 1-2x/week  PT DURATION: 10 weeks  PLANNED INTERVENTIONS: Therapeutic exercises, Therapeutic activity, Neuromuscular  re-education, Balance training, Gait training, Patient/Family education, Self Care, Joint mobilization, Joint manipulation, Aquatic Therapy, Dry Needling, Electrical stimulation, Spinal manipulation, Spinal mobilization, Cryotherapy, Moist heat, Traction, Manual therapy, and Re-evaluation.  PLAN FOR NEXT SESSION Lateral shift dosage. Assess UE ROM and periscapular strength and continue to progress hip and periscapular strength: D2 flexion and extension progression and face pull.    Ellin Goodie PT, DPT  [facility] [address] [place], [address] Phone: [phone]   Fax:  [phone]

## 2022-11-03 ENCOUNTER — Encounter: Payer: Medicare Other | Admitting: Physical Therapy

## 2022-11-07 ENCOUNTER — Ambulatory Visit: Payer: Medicare Other | Admitting: Physical Therapy

## 2022-11-09 ENCOUNTER — Ambulatory Visit: Payer: Medicare Other | Admitting: Dermatology

## 2022-11-14 ENCOUNTER — Ambulatory Visit: Payer: Medicare Other | Attending: Neurology | Admitting: Physical Therapy

## 2022-11-14 DIAGNOSIS — G8929 Other chronic pain: Secondary | ICD-10-CM | POA: Insufficient documentation

## 2022-11-14 DIAGNOSIS — M5459 Other low back pain: Secondary | ICD-10-CM | POA: Insufficient documentation

## 2022-11-14 DIAGNOSIS — M25561 Pain in right knee: Secondary | ICD-10-CM | POA: Diagnosis present

## 2022-11-14 DIAGNOSIS — M546 Pain in thoracic spine: Secondary | ICD-10-CM | POA: Insufficient documentation

## 2022-11-14 NOTE — Therapy (Signed)
OUTPATIENT PHYSICAL THERAPY THORACOLUMBAR TREATMENT   Patient Name: Patrick Wu MRN: [id number] DOB:10-29-1933, 89 y.o., male Today's Date: 11/14/2022  END OF SESSION:  PT End of Session - 11/14/22 1520     Visit Number 5    Number of Visits 20    Date for PT Re-Evaluation 12/26/22    Authorization Type Medicare 2024    Authorization - Visit Number 5    Authorization - Number of Visits 20    Progress Note Due on Visit 10    PT Start Time 1515    PT Stop Time 1600    PT Time Calculation (min) 45 min    Activity Tolerance Patient tolerated treatment well    Behavior During Therapy [facility] for tasks assessed/performed               Past Medical History:  Diagnosis Date   Asthma    Basal cell carcinoma 03/26/2019   Nodular. Glabella. Mohs 07/02/2019   Basal cell carcinoma 02/18/2021   R upper back, [facility]   Basal cell carcinoma 08/26/2021   Right preauricular. Nodular and infiltrative. Mohs 12/29/2021   Past Surgical History:  Procedure Laterality Date   APPENDECTOMY     There are no problems to display for this patient.   PCP:   REFERRING PROVIDER: Dr. Cristopher Peru  REFERRING DIAG: [phone] (ICD-10-CM) - Femoral neuropathy of lower extremity, bilateral  Rationale for Evaluation and Treatment: Rehabilitation  THERAPY DIAG:  Pain in thoracic spine  Chronic pain of right knee  ONSET DATE: June 2024   SUBJECTIVE:                                                                                                                                                                                           SUBJECTIVE STATEMENT: Pt reports that he is having ongoing thoracic pain, but he is more concerned about his leg weakness. He describes feeling extreme weakness especially when walking. He is still awaiting LE EMG from neurologist.   PERTINENT HISTORY:  Pt reports that he had a compression fracture in his thoracic spine (T9) back in early June. He has since had  resulting neuropathic pain that radiates around to his chest. He has taken gabapentin for the pain which has helped some, but he now mostly takes it at night. He experiences most of his pain during afternoon when standing for long periods of time. If he sits for 5 minutes, then the pain decreases. He describes having a history of a right sided TIA where he lost control of his right leg. Pt was referred for neuropathy but this is unrelated to his current  mid back pain.   PAIN:  Are you having pain? Yes: NPRS scale: 4-5/10 Pain location: T9  Pain description: Dull and achy  Aggravating factors: Standing for long periods of time Relieving factors: Sitting down for long periods of time.   PRECAUTIONS: None  RED FLAGS: None   WEIGHT BEARING RESTRICTIONS: No  FALLS:  Has patient fallen in last 6 months? No  LIVING ENVIRONMENT: Lives with: lives with their spouse Lives in: House/apartment Stairs: No Has following equipment at home: None  OCCUPATION: Multimedia programmer and teaches classes for [employer]   PLOF: Independent  PATIENT GOALS: Decrease mid back pain and improve posture.   NEXT MD VISIT: November 2024   OBJECTIVE:  Note: Objective measures were completed at Evaluation unless otherwise noted.  VITALS: BP 167/82 HR 58 SpO2 98%   DIAGNOSTIC FINDINGS:  CLINICAL DATA:  Thoracic region back pain with radiation around the sides.   EXAM: MRI THORACIC SPINE WITHOUT CONTRAST   TECHNIQUE: Multiplanar, multisequence MR imaging of the thoracic spine was performed. No intravenous contrast was administered.   COMPARISON:  Chest CT 08/20/2018   FINDINGS: Alignment:  Mild scoliotic curvature.   Vertebrae: Acute or subacute inferior endplate fracture at T9 with loss of height of 20%. No retropulsed bone. Old healed superior endplate fracture at T12, unchanged since a chest CT in 2020.   Benign appearing hemangioma within the T7 vertebral body.   Cord:  No cord compression or  focal cord lesion.   Paraspinal and other soft tissues: Negative except for a hiatal hernia.   Disc levels:   No significant finding from T1-2 through T5-6.   T6-7: Small central disc protrusion indents the thecal sac slightly but does not cause any neural compression.   T7-8 and T8-9: No disc abnormality or significant stenosis. 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   Inferior endplate compression fracture at T9 as noted above with marrow edema. Bilateral facet joint effusions and posterior element edema. No compressive stenosis of the central canal. Bilateral foraminal stenosis that could affect either or both T9 nerves. Findings could certainly relate to regional back pain. No finding to suggest that this is anything other than a benign fracture. The T9-10 disc bulges minimally.   At T10, there is some edema within the right pedicle, probably either deriving from the facet arthropathy or indicating a stress phenomenon. No vertebral body compression fracture.   T10-11: Mild bulging of the disc towards the left. Bilateral facet degeneration. No compressive stenosis.   T11-12: No disc abnormality. Bilateral facet degeneration and hypertrophy, worse on the right. No compressive stenosis.   T12-L1: Mild bulging of the disc.  No compressive stenosis.   IMPRESSION: 1. Acute or subacute inferior endplate fracture at T9 with loss of height of 20%. No retropulsed bone. This could be a cause of regional back pain. 2. 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 Bilateral foraminal stenosis that could affect either or both T9 nerves. Findings could also relate to regional back pain. 3. Old healed superior endplate fracture at T12, unchanged since 2020. 4. Edema within the right pedicle at T10, probably either deriving from the facet arthropathy or indicating a stress phenomenon. 5. 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     Electronically Signed   By: Paulina Fusi M.D.   On: 08/02/2022 10:16  PATIENT SURVEYS:  FOTO 53/100 with target of 64   SCREENING FOR RED FLAGS: Bowel or bladder incontinence: No  Spinal tumors: No Cauda equina syndrome: No Compression fracture: No Abdominal aneurysm: No  COGNITION: Overall cognitive status: Within functional limits for tasks assessed     SENSATION: Numbness and tingling in bilateral feet that do not radiate up to low back   MUSCLE LENGTH: Hamstrings: Right 90 deg; Left 90 deg Thomas test: Negative bilaterally   POSTURE: rounded shoulders and forward head  PALPATION:  No tenderness to palpation   UE ROM:     Active ROM Right eval Left eval  Shoulder flexion    Shoulder extension    Shoulder abduction    Shoulder adduction    Shoulder internal rotation    Shoulder external rotation    Elbow flexion    Elbow extension    Wrist flexion    Wrist extension    Wrist ulnar deviation    Wrist radial deviation    Wrist pronation    Wrist supination    (Blank rows = not tested)        UE MMT:   Active ROM Right eval Left eval  Shoulder flexion 4+ 4+  Shoulder extension    Shoulder abduction 4+ 4+  Shoulder adduction    Shoulder internal rotation 4 4  Shoulder external rotation 4 4  Mid Trap  NT  NT   Lower Trap  NT  NT  Elbow flexion    Elbow extension    Wrist flexion    Wrist extension    Wrist ulnar deviation    Wrist radial deviation    Wrist pronation    Wrist supination    (Blank rows = not tested)   LUMBAR ROM:   AROM eval  Flexion 100%  Extension 100%  Right lateral flexion 100%  Left lateral flexion 100%  Right rotation 100%  Left rotation 100%   (Blank rows = not tested)  LOWER EXTREMITY ROM:     Active  Right eval Left eval  Hip flexion    Hip extension    Hip abduction    Hip adduction    Hip internal rotation    Hip external rotation    Knee flexion    Knee  extension    Ankle dorsiflexion    Ankle plantarflexion    Ankle inversion    Ankle eversion     (Blank rows = not tested)  LOWER EXTREMITY MMT:    MMT Right eval Left eval  Hip flexion 4 4  Hip extension 4 4  Hip abduction 4- 4-  Hip adduction 4- 4-  Hip internal rotation    Hip external rotation    Knee flexion 4 4  Knee extension 4 4  Ankle dorsiflexion 4 4  Ankle plantarflexion    Ankle inversion    Ankle eversion     (Blank rows = not tested)  LUMBAR SPECIAL TESTS:  Straight leg raise test: Negative, FABER test: Negative, Thomas test: Negative, and FADIR Negative   FUNCTIONAL TESTS:  None performed   GAIT: Distance walked: 50 ft  Assistive device utilized: None Level of assistance: Complete Independence Comments: Decreased stride length and step length bilaterally  TODAY'S TREATMENT:  DATE:   11/14/22: Nu-Step seat and arms at 9 with resistance at 3 for 5 min  Ambulation with rollator 10x around OMEGA machine   Ely's Test: Positive  Prone Quad Stretch 2 x 60 sec  Hip MMT  -Flex R/L 4+/4 -Abd R/L 4/4 -Add R/L 4/4  -Ext R/L 4/4  Standing Hip Abduction with 1 UE support 2 x 10 Standing Hip Abduction with BUE support with #5 AW 1 x 10  Standing Hip Abduction with no UE support with #5 AW 1 x 10  Standing Hip Flexion #5 AW with no UE support 2 x 10    10/31/22: TM at 2.3 mph for 6 min  OMEGA Lat Pull Down Machine #20 1 x 10  OMEGA Lat Pull Down Machine #35 1 x 10  OMEGA Lat Pull Down Machine #45  1 x 10  Corner Pec Stretch 2 x 30 sec  Serratus Wall Slides with pillow case 1 x 10  Bent over horizontal abduction #1 1 x 10  Bent over horizontal abduction #2 1 x 10  Bent over horizontal abduction #3 1 x 10  Bird Dog Dogs 3 x 10   10/27/22: TM at 1.5 mph at 0 incline for 4 min  TM at 1.5 mph at 4 incline for 3 min  Shoulder  Horizontal Abduction AROM 1 x 10  Shoulder Horizontal Abduction yellow band 2 x 10  Hip Height Difference: L PSIS >R PSIS  Right Lateral Shift 1 x 10 with 5 sec hold  Wall Push Up with Plus 1 x 10  Lat Pull Down with Green TB 1 x 10     10/19/22: Nu-step with seat at 9 and resistance at 3 for 5 min Wall Occiput Test: 8 inch   Periscapular MMT: -Mid Trap R/L 4/4 -Rhomboid R/L 4/4 -Lower Trap R/L 4-/4- -Shoulder Ext R/L 4/4  Standing Hip Ext with forward lean  1 x 10 Standing Hip Ext without forward lean 1 x 10  Quadruped with hip extension 1 x 10  OMEGA Seated Rows #20 1 x 10  OMEGA Seated Rows #25 1 x 10   OMEGA Seated Rows #35 1 x 10  D2 Flexion/Extension with yellow band 1 x 10   PATIENT EDUCATION:  Education details: Form and technique for correct performance of exercise  Person educated: Patient Education method: Explanation, Demonstration, Verbal cues, and Handouts Education comprehension: verbalized understanding, returned demonstration, and verbal cues required  HOME EXERCISE PROGRAM: Access Code: [email or web address] URL: [email or web address] Date: 11/14/2022 Prepared by: Ellin Goodie  Exercises - Prone Quadriceps Stretch with Strap  - 2-3 x weekly - 3 reps - 60 sec  hold - Doorway Pec Stretch at 60 Elevation  - 1 x daily - 3 reps - 30-60 sec hold - Serratus Activation at Wall  - 3-4 x weekly - 3 sets - 10 reps - Right Standing Lateral Shift Correction at Wall - Repetitions  - 1 x daily - 10 reps - 5 sec  hold - Standing Shoulder Single Arm PNF D2 Flexion with Resistance  - 3-4 x weekly - 3 sets - 10 reps - Single Arm Bent Over Shoulder Horizontal Abduction with Dumbbell - Palm Down  - 3-4 x weekly - 3 sets - 10 reps - Seated Row Cable Machine  - 3-4 x weekly - 3 sets - 10 reps - Lat Pull Down  - 3-4 x weekly - 3 sets - 10 reps - Bird Dog  - 3-4 x weekly - 3 sets -  10 reps - Standing Hip Abduction with Counter Support  - 3-4 x weekly - 3 sets - 10 reps -  Standing Hip Flexion March  - 3-4 x weekly - 3 sets - 10 reps - Prone Quad Stretch with Strap  - 3-4 x weekly - 3 reps - 60 sec  hold   ASSESSMENT:  CLINICAL IMPRESSION: Pt continues to show signs and symptoms of neurogenic claudication with fatigue and heaviness with ambulation. He demonstrate ongoing hip weakness and hip flexor tightness that could potentially driving lumbar lordosis. Modified HEP to include increased focus on LE strengthening with reduced lumbar lordosis. He will continue to benefit from skilled PT to address these aforementioned deficits to improve posture and ability to stand and walk for longer periods of time to continue to maintain independence and well being like walking dog and getting groceries.   OBJECTIVE IMPAIRMENTS: hypomobility, impaired flexibility, postural dysfunction, and pain.   ACTIVITY LIMITATIONS: carrying, lifting, standing, and squatting  PARTICIPATION LIMITATIONS: shopping, community activity, occupation, and yard work  PERSONAL FACTORS: Age, Past/current experiences, and Low back pain are also affecting patient's functional outcome.   REHAB POTENTIAL: Good  CLINICAL DECISION MAKING: Stable/uncomplicated  EVALUATION COMPLEXITY: Low   GOALS: Goals reviewed with patient? No  SHORT TERM GOALS: Target date: 10/31/2022  PT reviewed the following HEP with patient with patient able to demonstrate a set of the following with min cuing for correction needed. PT educated patient on parameters of therex (how/when to inc/decrease intensity, frequency, rep/set range, stretch hold time, and purpose of therex) with verbalized understanding.  Baseline: NT 10/19/22 Performing independently  Goal status: ACHIEVED     LONG TERM GOALS: Target date: 12/26/2022  Patient will have improved function and activity level as evidenced by an increase in FOTO score by 10 points or more.  Baseline: 53 with target of 63  Goal status: ONGOING   2.  Patient will  demonstrate improved periscapular strength by (1/3 grade MMT i.e 4- to 4) as evidence of improved posture and for offloading of anterior portion of thoracic vertebrae to prevent compression fractures and improve thoracic function.  Baseline: Mid Trap R/L 4/4, Rhomboid R/L 4/4, Lower Trap R/L 4-/4- , Shoulder Ext R/L 4/4  Goal status: ONGOING   3.  Patient will demonstrate a wall to occiput test value of <2.0 cm as evidence of decreased risk of vertebral compression fracture in the setting of kyphosis.  Baseline: 8 inch  Goal status: ONGOING   4.  Patient will be able to carry out daily activities without >3/10 NRPS in thoracic spine as evidence of improve thoracic function.  Baseline: 4-5/10 NRPS Goal status: ONGOING     PLAN:  PT FREQUENCY: 1-2x/week  PT DURATION: 10 weeks  PLANNED INTERVENTIONS: Therapeutic exercises, Therapeutic activity, Neuromuscular re-education, Balance training, Gait training, Patient/Family education, Self Care, Joint mobilization, Joint manipulation, Aquatic Therapy, Dry Needling, Electrical stimulation, Spinal manipulation, Spinal mobilization, Cryotherapy, Moist heat, Traction, Manual therapy, and Re-evaluation.  PLAN FOR NEXT SESSION Reassess goals. Lateral shift dosage. Continue to progress LE strength: OMEGA Leg press .    Ellin Goodie PT, DPT  [facility] [address], [place], [address] Phone: [phone]   Fax:  [phone]

## 2022-11-24 ENCOUNTER — Ambulatory Visit: Payer: Medicare Other | Admitting: Physical Therapy

## 2022-11-24 DIAGNOSIS — M5459 Other low back pain: Secondary | ICD-10-CM

## 2022-11-24 DIAGNOSIS — M546 Pain in thoracic spine: Secondary | ICD-10-CM | POA: Diagnosis not present

## 2022-11-24 DIAGNOSIS — G8929 Other chronic pain: Secondary | ICD-10-CM

## 2022-11-24 NOTE — Therapy (Signed)
OUTPATIENT PHYSICAL THERAPY THORACOLUMBAR TREATMENT   Patient Name: Patrick Wu MRN: [id number] DOB:1933/03/25, 89 y.o., male Today's Date: 11/24/2022  END OF SESSION:  PT End of Session - 11/24/22 0939     Visit Number 6    Number of Visits 20    Date for PT Re-Evaluation 12/26/22    Authorization Type Medicare 2024    Authorization - Visit Number 6    Authorization - Number of Visits 20    Progress Note Due on Visit 10    PT Start Time 0805    PT Stop Time 0845    PT Time Calculation (min) 40 min    Activity Tolerance Patient tolerated treatment well    Behavior During Therapy [facility] for tasks assessed/performed                Past Medical History:  Diagnosis Date   Asthma    Basal cell carcinoma 03/26/2019   Nodular. Glabella. Mohs 07/02/2019   Basal cell carcinoma 02/18/2021   R upper back, EDC   Basal cell carcinoma 08/26/2021   Right preauricular. Nodular and infiltrative. Mohs 12/29/2021   Past Surgical History:  Procedure Laterality Date   APPENDECTOMY     There are no problems to display for this patient.   PCP:   REFERRING PROVIDER: Dr. Cristopher Peru  REFERRING DIAG: [phone] (ICD-10-CM) - Femoral neuropathy of lower extremity, bilateral  Rationale for Evaluation and Treatment: Rehabilitation  THERAPY DIAG:  Other low back pain  Chronic pain of right knee  Pain in thoracic spine  ONSET DATE: June 2024   SUBJECTIVE:                                                                                                                                                                                           SUBJECTIVE STATEMENT: Pt reports that he is having ongoing thoracic pain (unchanged from last visit), but he is more concerned about his leg weakness. Leg weakness has not changed since last visit. Current back pain 0/10 (gets progressively worse throughout the day though). Still gets up to a 5/10 by end of day. Sitting relieves it. Lying down he  feels shocks along ribs and sometimes at xyphoid process. Hip weakness seems to come and go without being tied to anything in particular. Weakness with walking is not different on level vs unlevel surfaces. Pt does not feel like it is a balance issue.   PERTINENT HISTORY:  Pt reports that he had a compression fracture in his thoracic spine (T9) back in early June. He has since had resulting neuropathic pain that radiates around to his chest. He  has taken gabapentin for the pain which has helped some, but he now mostly takes it at night. He experiences most of his pain during afternoon when standing for long periods of time. If he sits for 5 minutes, then the pain decreases. He describes having a history of a right sided TIA where he lost control of his right leg. Pt was referred for neuropathy but this is unrelated to his current mid back pain.   PAIN:  Are you having pain? Yes: NPRS scale: 4-5/10 Pain location: T9  Pain description: Dull and achy  Aggravating factors: Standing for long periods of time Relieving factors: Sitting down for long periods of time.   PRECAUTIONS: None  RED FLAGS: None   WEIGHT BEARING RESTRICTIONS: No  FALLS:  Has patient fallen in last 6 months? No  LIVING ENVIRONMENT: Lives with: lives with their spouse Lives in: House/apartment Stairs: No Has following equipment at home: None  OCCUPATION: Multimedia programmer and teaches classes for [employer]   PLOF: Independent  PATIENT GOALS: Decrease mid back pain and improve posture.   NEXT MD VISIT: November 2024   OBJECTIVE:  Note: Objective measures were completed at Evaluation unless otherwise noted.  VITALS: BP 167/82 HR 58 SpO2 98%   DIAGNOSTIC FINDINGS:  CLINICAL DATA:  Thoracic region back pain with radiation around the sides.   EXAM: MRI THORACIC SPINE WITHOUT CONTRAST   TECHNIQUE: Multiplanar, multisequence MR imaging of the thoracic spine was performed. No intravenous contrast was administered.    COMPARISON:  Chest CT 08/20/2018   FINDINGS: Alignment:  Mild scoliotic curvature.   Vertebrae: Acute or subacute inferior endplate fracture at T9 with loss of height of 20%. No retropulsed bone. Old healed superior endplate fracture at T12, unchanged since a chest CT in 2020.   Benign appearing hemangioma within the T7 vertebral body.   Cord:  No cord compression or focal cord lesion.   Paraspinal and other soft tissues: Negative except for a hiatal hernia.   Disc levels:   No significant finding from T1-2 through T5-6.   T6-7: Small central disc protrusion indents the thecal sac slightly but does not cause any neural compression.   T7-8 and T8-9: No disc abnormality or significant stenosis. 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   Inferior endplate compression fracture at T9 as noted above with marrow edema. Bilateral facet joint effusions and posterior element edema. No compressive stenosis of the central canal. Bilateral foraminal stenosis that could affect either or both T9 nerves. Findings could certainly relate to regional back pain. No finding to suggest that this is anything other than a benign fracture. The T9-10 disc bulges minimally.   At T10, there is some edema within the right pedicle, probably either deriving from the facet arthropathy or indicating a stress phenomenon. No vertebral body compression fracture.   T10-11: Mild bulging of the disc towards the left. Bilateral facet degeneration. No compressive stenosis.   T11-12: No disc abnormality. Bilateral facet degeneration and hypertrophy, worse on the right. No compressive stenosis.   T12-L1: Mild bulging of the disc.  No compressive stenosis.   IMPRESSION: 1. Acute or subacute inferior endplate fracture at T9 with loss of height of 20%. No retropulsed bone. This could be a cause of regional back pain. 2. 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 Bilateral foraminal stenosis that could affect either or both T9 nerves. Findings could also relate to regional back pain. 3. Old healed superior endplate fracture at  T12, unchanged since 2020. 4. Edema within the right pedicle at T10, probably either deriving from the facet arthropathy or indicating a stress phenomenon. 5. 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     Electronically Signed   By: Paulina Fusi M.D.   On: 08/02/2022 10:16  PATIENT SURVEYS:  FOTO 53/100 with target of 64   SCREENING FOR RED FLAGS: Bowel or bladder incontinence: No Spinal tumors: No Cauda equina syndrome: No Compression fracture: No Abdominal aneurysm: No  COGNITION: Overall cognitive status: Within functional limits for tasks assessed     SENSATION: Numbness and tingling in bilateral feet that do not radiate up to low back   MUSCLE LENGTH: Hamstrings: Right 90 deg; Left 90 deg Thomas test: Negative bilaterally   POSTURE: rounded shoulders and forward head  PALPATION:  No tenderness to palpation   UE ROM:     Active ROM Right eval Left eval  Shoulder flexion    Shoulder extension    Shoulder abduction    Shoulder adduction    Shoulder internal rotation    Shoulder external rotation    Elbow flexion    Elbow extension    Wrist flexion    Wrist extension    Wrist ulnar deviation    Wrist radial deviation    Wrist pronation    Wrist supination    (Blank rows = not tested)        UE MMT:   Active ROM Right eval Left eval  Shoulder flexion 4+ 4+  Shoulder extension    Shoulder abduction 4+ 4+  Shoulder adduction    Shoulder internal rotation 4 4  Shoulder external rotation 4 4  Mid Trap  NT  NT   Lower Trap  NT  NT  Elbow flexion    Elbow extension    Wrist flexion    Wrist extension    Wrist ulnar deviation    Wrist radial deviation    Wrist pronation    Wrist supination    (Blank rows = not tested)   LUMBAR ROM:    AROM eval  Flexion 100%  Extension 100%  Right lateral flexion 100%  Left lateral flexion 100%  Right rotation 100%  Left rotation 100%   (Blank rows = not tested)  LOWER EXTREMITY ROM:     Active  Right eval Left eval  Hip flexion    Hip extension    Hip abduction    Hip adduction    Hip internal rotation    Hip external rotation    Knee flexion    Knee extension    Ankle dorsiflexion    Ankle plantarflexion    Ankle inversion    Ankle eversion     (Blank rows = not tested)  LOWER EXTREMITY MMT:    MMT Right eval Left eval  Hip flexion 4 4  Hip extension 4 4  Hip abduction 4- 4-  Hip adduction 4- 4-  Hip internal rotation    Hip external rotation    Knee flexion 4 4  Knee extension 4 4  Ankle dorsiflexion 4 4  Ankle plantarflexion    Ankle inversion    Ankle eversion     (Blank rows = not tested)  LUMBAR SPECIAL TESTS:  Straight leg raise test: Negative, FABER test: Negative, Thomas test: Negative, and FADIR Negative   FUNCTIONAL TESTS:  None performed   GAIT: Distance walked: 50 ft  Assistive device utilized: None Level of assistance: Complete Independence Comments:  Decreased stride length and step length bilaterally  TODAY'S TREATMENT:                                                                                                                              DATE:  11/24/22: Nu-Step seat and arms at 9 with resistance at 3 for 5 min  FOTO - low back - 63 with 64 expected Wall to occiput - 8 in Monster walks with blue band around ankles - 2x25m  - min cuing to lift foot on eccentric phase Monster walks with green band and bolster step overs - 4x85m  - min cuing for wider steps over bolster Mini squats with 10# and blue abduction band around knees with 10# weight 2x10  - slight pain in knees Standing marches with 5# AW's and red band around feet 3x20  - min cuing to raise knees to 90 deg flexion   11/14/22: Nu-Step seat and arms at 9 with  resistance at 3 for 5 min  Ambulation with rollator 10x around OMEGA machine   Ely's Test: Positive  Prone Quad Stretch 2 x 60 sec  Hip MMT  -Flex R/L 4+/4 -Abd R/L 4/4 -Add R/L 4/4  -Ext R/L 4/4  Standing Hip Abduction with 1 UE support 2 x 10 Standing Hip Abduction with BUE support with #5 AW 1 x 10  Standing Hip Abduction with no UE support with #5 AW 1 x 10  Standing Hip Flexion #5 AW with no UE support 2 x 10    10/31/22: TM at 2.3 mph for 6 min  OMEGA Lat Pull Down Machine #20 1 x 10  OMEGA Lat Pull Down Machine #35 1 x 10  OMEGA Lat Pull Down Machine #45  1 x 10  Corner Pec Stretch 2 x 30 sec  Serratus Wall Slides with pillow case 1 x 10  Bent over horizontal abduction #1 1 x 10  Bent over horizontal abduction #2 1 x 10  Bent over horizontal abduction #3 1 x 10  Bird Dog Dogs 3 x 10   10/27/22: TM at 1.5 mph at 0 incline for 4 min  TM at 1.5 mph at 4 incline for 3 min  Shoulder Horizontal Abduction AROM 1 x 10  Shoulder Horizontal Abduction yellow band 2 x 10  Hip Height Difference: L PSIS >R PSIS  Right Lateral Shift 1 x 10 with 5 sec hold  Wall Push Up with Plus 1 x 10  Lat Pull Down with Green TB 1 x 10     10/19/22: Nu-step with seat at 9 and resistance at 3 for 5 min Wall Occiput Test: 8 inch   Periscapular MMT: -Mid Trap R/L 4/4 -Rhomboid R/L 4/4 -Lower Trap R/L 4-/4- -Shoulder Ext R/L 4/4  Standing Hip Ext with forward lean  1 x 10 Standing Hip Ext without forward lean 1 x 10  Quadruped with hip extension 1 x 10  OMEGA Seated Rows #20  1 x 10  OMEGA Seated Rows #25 1 x 10   OMEGA Seated Rows #35 1 x 10  D2 Flexion/Extension with yellow band 1 x 10   PATIENT EDUCATION:  Education details: Form and technique for correct performance of exercise  Person educated: Patient Education method: Explanation, Demonstration, Verbal cues, and Handouts Education comprehension: verbalized understanding, returned demonstration, and verbal cues required  HOME  EXERCISE PROGRAM: Access Code: [email or web address] URL: [email or web address] Date: 11/24/2022 Prepared by: Ellin Goodie  Exercises - Bird Dog  - 3-4 x weekly - 3 sets - 10 reps - Standing Hip Abduction with Counter Support  - 3-4 x weekly - 3 sets - 10 reps - Standing Hip Flexion March  - 3-4 x weekly - 3 sets - 10 reps - Prone Quad Stretch with Strap  - 3-4 x weekly - 3 reps - 60 sec  hold - Forward Monster Walks  - 3-4 x weekly - 3-4 sets - 20 reps   ASSESSMENT:  CLINICAL IMPRESSION: Pt continues to show signs and symptoms of neurogenic claudication with fatigue and heaviness with ambulation. Pt achieved FOTO goal showing improved function, but wall to occiput distance was unchanged from initial measurement and back pain still reaches 5/10 on NPRS by the end of the day. PT created new goal to address leg weakness that is limiting walking and ADLs. Pt was able to perform marches with more resistance, showing progress towards hip strength goal. Pt would drag foot on eccentric portion of monster walks to compensate for hip abductor weakness despite cuing. This was resolved by decreasing resistance and adding step over component to ensure foot lift. He will continue to benefit from skilled PT to address these aforementioned deficits to improve posture and ability to stand and walk for longer periods of time to continue to maintain independence and well being like walking dog and getting groceries.   OBJECTIVE IMPAIRMENTS: hypomobility, impaired flexibility, postural dysfunction, and pain.   ACTIVITY LIMITATIONS: carrying, lifting, standing, and squatting  PARTICIPATION LIMITATIONS: shopping, community activity, occupation, and yard work  PERSONAL FACTORS: Age, Past/current experiences, and Low back pain are also affecting patient's functional outcome.   REHAB POTENTIAL: Good  CLINICAL DECISION MAKING: Stable/uncomplicated  EVALUATION COMPLEXITY: Low   GOALS: Goals reviewed with  patient? No  SHORT TERM GOALS: Target date: 10/31/2022  PT reviewed the following HEP with patient with patient able to demonstrate a set of the following with min cuing for correction needed. PT educated patient on parameters of therex (how/when to inc/decrease intensity, frequency, rep/set range, stretch hold time, and purpose of therex) with verbalized understanding.  Baseline: NT 10/19/22 Performing independently  Goal status: ACHIEVED     LONG TERM GOALS: Target date: 12/26/2022  Patient will have improved function and activity level as evidenced by an increase in FOTO score by 10 points or more.  Baseline: 53 with target of 63 : 11/24/22 = 63 Goal status: ACHIEVED   2.  Patient will demonstrate improved periscapular strength by (1/3 grade MMT i.e 4- to 4) as evidence of improved posture and for offloading of anterior portion of thoracic vertebrae to prevent compression fractures and improve thoracic function.  Baseline: Mid Trap R/L 4/4, Rhomboid R/L 4/4, Lower Trap R/L 4-/4- , Shoulder Ext R/L 4/4  Goal status: DEFERRED   3.  Patient will demonstrate a wall to occiput test value of <2.0 cm as evidence of decreased risk of vertebral compression fracture in the setting of kyphosis.  Baseline: 8 inch (8 inch  11/24/22) Goal status: DEFERRED   4.  Patient will be able to carry out daily activities without >3/10 NRPS in thoracic spine as evidence of improve thoracic function.  Baseline: 4-5/10 NRPS - (0/10 at beginning of day, but increases to 5/10 by end) Goal status: DEFERRED   5. Pt will demonstrate improved hip and knee strength by 1/3 grade MMT (i.e. 4- to 4) to address feelings of weakness and instability when walking and performing ADLs  Baseline:  Hip flexion 4 4  Hip extension 4 4  Hip abduction 4- 4-  Hip adduction 4- 4-  Hip internal rotation    Hip external rotation    Knee flexion 4 4  Knee extension 4 4   Goal status: ONGOING   PLAN:  PT FREQUENCY:  1-2x/week  PT DURATION: 10 weeks  PLANNED INTERVENTIONS: Therapeutic exercises, Therapeutic activity, Neuromuscular re-education, Balance training, Gait training, Patient/Family education, Self Care, Joint mobilization, Joint manipulation, Aquatic Therapy, Dry Needling, Electrical stimulation, Spinal manipulation, Spinal mobilization, Cryotherapy, Moist heat, Traction, Manual therapy, and Re-evaluation.  PLAN FOR NEXT SESSION Progress hip strengthening exercises by increasing resistance and adding balance components for stability. Incorporate more single leg strength exercises (I.e OMEGA single leg leg press)   Arcelia Jew, Student-PT [employer] DPT Class of 2025

## 2022-12-01 ENCOUNTER — Ambulatory Visit: Payer: Medicare Other | Admitting: Physical Therapy

## 2022-12-01 DIAGNOSIS — M546 Pain in thoracic spine: Secondary | ICD-10-CM

## 2022-12-01 DIAGNOSIS — M5459 Other low back pain: Secondary | ICD-10-CM

## 2022-12-01 NOTE — Therapy (Signed)
OUTPATIENT PHYSICAL THERAPY THORACOLUMBAR TREATMENT   Patient Name: Patrick Wu MRN: [id number] DOB:[birth year], 89 y.o., male Today's Date: 12/01/2022  END OF SESSION:  PT End of Session - 12/01/22 0857     Visit Number 7    Number of Visits 20    Date for PT Re-Evaluation 12/26/22    Authorization Type Medicare 2024    Authorization - Visit Number 7    Authorization - Number of Visits 20    Progress Note Due on Visit 10    PT Start Time 0900    PT Stop Time 0945    PT Time Calculation (min) 45 min    Activity Tolerance Patient tolerated treatment well    Behavior During Therapy [facility] for tasks assessed/performed                Past Medical History:  Diagnosis Date   Asthma    Basal cell carcinoma 03/26/2019   Nodular. Glabella. Mohs 07/02/2019   Basal cell carcinoma 02/18/2021   R upper back, EDC   Basal cell carcinoma 08/26/2021   Right preauricular. Nodular and infiltrative. Mohs 12/29/2021   Past Surgical History:  Procedure Laterality Date   APPENDECTOMY     There are no problems to display for this patient.   PCP:   REFERRING PROVIDER: Dr. Cristopher Peru  REFERRING DIAG: [phone] (ICD-10-CM) - Femoral neuropathy of lower extremity, bilateral  Rationale for Evaluation and Treatment: Rehabilitation  THERAPY DIAG:  Other low back pain  Pain in thoracic spine  ONSET DATE: June 2024   SUBJECTIVE:                                                                                                                                                                                           SUBJECTIVE STATEMENT: Still feels occasional LE weakness. Recently had EMG & NCV testing. Pt is concerned that results show decreased NCV of bilateral peroneal nerves.  PERTINENT HISTORY:  Pt reports that he had a compression fracture in his thoracic spine (T9) back in early June. He has since had resulting neuropathic pain that radiates around to his chest. He has taken  gabapentin for the pain which has helped some, but he now mostly takes it at night. He experiences most of his pain during afternoon when standing for long periods of time. If he sits for 5 minutes, then the pain decreases. He describes having a history of a right sided TIA where he lost control of his right leg. Pt was referred for neuropathy but this is unrelated to his current mid back pain.   PAIN:  Are you having pain? Yes:  NPRS scale: 4-5/10 Pain location: T9  Pain description: Dull and achy  Aggravating factors: Standing for long periods of time Relieving factors: Sitting down for long periods of time.   PRECAUTIONS: None  RED FLAGS: None   WEIGHT BEARING RESTRICTIONS: No  FALLS:  Has patient fallen in last 6 months? No  LIVING ENVIRONMENT: Lives with: lives with their spouse Lives in: House/apartment Stairs: No Has following equipment at home: None  OCCUPATION: Multimedia programmer and teaches classes for [employer]   PLOF: Independent  PATIENT GOALS: Decrease mid back pain and improve posture.   NEXT MD VISIT: November 2024   OBJECTIVE:  Note: Objective measures were completed at Evaluation unless otherwise noted.  VITALS: BP 167/82 HR 58 SpO2 98%   DIAGNOSTIC FINDINGS:  CLINICAL DATA:  Thoracic region back pain with radiation around the sides.   EXAM: MRI THORACIC SPINE WITHOUT CONTRAST   TECHNIQUE: Multiplanar, multisequence MR imaging of the thoracic spine was performed. No intravenous contrast was administered.   COMPARISON:  Chest CT 08/20/2018   FINDINGS: Alignment:  Mild scoliotic curvature.   Vertebrae: Acute or subacute inferior endplate fracture at T9 with loss of height of 20%. No retropulsed bone. Old healed superior endplate fracture at T12, unchanged since a chest CT in 2020.   Benign appearing hemangioma within the T7 vertebral body.   Cord:  No cord compression or focal cord lesion.   Paraspinal and other soft tissues: Negative except for a  hiatal hernia.   Disc levels:   No significant finding from T1-2 through T5-6.   T6-7: Small central disc protrusion indents the thecal sac slightly but does not cause any neural compression.   T7-8 and T8-9: No disc abnormality or significant stenosis. 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   Inferior endplate compression fracture at T9 as noted above with marrow edema. Bilateral facet joint effusions and posterior element edema. No compressive stenosis of the central canal. Bilateral foraminal stenosis that could affect either or both T9 nerves. Findings could certainly relate to regional back pain. No finding to suggest that this is anything other than a benign fracture. The T9-10 disc bulges minimally.   At T10, there is some edema within the right pedicle, probably either deriving from the facet arthropathy or indicating a stress phenomenon. No vertebral body compression fracture.   T10-11: Mild bulging of the disc towards the left. Bilateral facet degeneration. No compressive stenosis.   T11-12: No disc abnormality. Bilateral facet degeneration and hypertrophy, worse on the right. No compressive stenosis.   T12-L1: Mild bulging of the disc.  No compressive stenosis.   IMPRESSION: 1. Acute or subacute inferior endplate fracture at T9 with loss of height of 20%. No retropulsed bone. This could be a cause of regional back pain. 2. 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 Bilateral foraminal stenosis that could affect either or both T9 nerves. Findings could also relate to regional back pain. 3. Old healed superior endplate fracture at T12, unchanged since 2020. 4. Edema within the right pedicle at T10, probably either deriving from the facet arthropathy or indicating a stress phenomenon. 5. 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     Electronically Signed   By: Paulina Fusi  M.D.   On: 08/02/2022 10:16  PATIENT SURVEYS:  FOTO 53/100 with target of 64   SCREENING FOR RED FLAGS: Bowel or bladder incontinence: No Spinal tumors: No Cauda equina syndrome: No Compression fracture: No Abdominal aneurysm:  No  COGNITION: Overall cognitive status: Within functional limits for tasks assessed     SENSATION: Numbness and tingling in bilateral feet that do not radiate up to low back   MUSCLE LENGTH: Hamstrings: Right 90 deg; Left 90 deg Thomas test: Negative bilaterally   POSTURE: rounded shoulders and forward head  PALPATION:  No tenderness to palpation   UE ROM:     Active ROM Right eval Left eval  Shoulder flexion    Shoulder extension    Shoulder abduction    Shoulder adduction    Shoulder internal rotation    Shoulder external rotation    Elbow flexion    Elbow extension    Wrist flexion    Wrist extension    Wrist ulnar deviation    Wrist radial deviation    Wrist pronation    Wrist supination    (Blank rows = not tested)        UE MMT:   Active ROM Right eval Left eval  Shoulder flexion 4+ 4+  Shoulder extension    Shoulder abduction 4+ 4+  Shoulder adduction    Shoulder internal rotation 4 4  Shoulder external rotation 4 4  Mid Trap  NT  NT   Lower Trap  NT  NT  Elbow flexion    Elbow extension    Wrist flexion    Wrist extension    Wrist ulnar deviation    Wrist radial deviation    Wrist pronation    Wrist supination    (Blank rows = not tested)   LUMBAR ROM:   AROM eval  Flexion 100%  Extension 100%  Right lateral flexion 100%  Left lateral flexion 100%  Right rotation 100%  Left rotation 100%   (Blank rows = not tested)  LOWER EXTREMITY ROM:     Active  Right eval Left eval  Hip flexion    Hip extension    Hip abduction    Hip adduction    Hip internal rotation    Hip external rotation    Knee flexion    Knee extension    Ankle dorsiflexion    Ankle plantarflexion    Ankle inversion     Ankle eversion     (Blank rows = not tested)  LOWER EXTREMITY MMT:    MMT Right eval Left eval  Hip flexion 4 4  Hip extension 4 4  Hip abduction 4- 4-  Hip adduction 4- 4-  Hip internal rotation    Hip external rotation    Knee flexion 4 4  Knee extension 4 4  Ankle dorsiflexion 4 4  Ankle plantarflexion    Ankle inversion    Ankle eversion     (Blank rows = not tested)  LUMBAR SPECIAL TESTS:  Straight leg raise test: Negative, FABER test: Negative, Thomas test: Negative, and FADIR Negative   FUNCTIONAL TESTS:  None performed   GAIT: Distance walked: 50 ft  Assistive device utilized: None Level of assistance: Complete Independence Comments: Decreased stride length and step length bilaterally  TODAY'S TREATMENT:  DATE:  11/25/22: Nu-Step seat and arms at 9 with resistance at 3 for 5 min Resisted eversion with green band around balls of feet - 1x30 per side Peroneal nerve glides 2x10 per side Single leg balance on foam pad with blaze pods      11/24/22: Nu-Step seat and arms at 9 with resistance at 3 for 5 min  FOTO - low back - 63 with 64 expected Wall to occiput - 8 in Monster walks with blue band around ankles - 2x66m  - min cuing to lift foot on eccentric phase Monster walks with green band and bolster step overs - 4x17m  - min cuing for wider steps over bolster Mini squats with 10# and blue abduction band around knees with 10# weight 2x10  - slight pain in knees Standing marches with 5# AW's and red band around feet 3x20  - min cuing to raise knees to 90 deg flexion   11/14/22: Nu-Step seat and arms at 9 with resistance at 3 for 5 min  Ambulation with rollator 10x around OMEGA machine   Ely's Test: Positive  Prone Quad Stretch 2 x 60 sec  Hip MMT  -Flex R/L 4+/4 -Abd R/L 4/4 -Add R/L 4/4  -Ext R/L 4/4  Standing Hip Abduction  with 1 UE support 2 x 10 Standing Hip Abduction with BUE support with #5 AW 1 x 10  Standing Hip Abduction with no UE support with #5 AW 1 x 10  Standing Hip Flexion #5 AW with no UE support 2 x 10    10/31/22: TM at 2.3 mph for 6 min  OMEGA Lat Pull Down Machine #20 1 x 10  OMEGA Lat Pull Down Machine #35 1 x 10  OMEGA Lat Pull Down Machine #45  1 x 10  Corner Pec Stretch 2 x 30 sec  Serratus Wall Slides with pillow case 1 x 10  Bent over horizontal abduction #1 1 x 10  Bent over horizontal abduction #2 1 x 10  Bent over horizontal abduction #3 1 x 10  Bird Dog Dogs 3 x 10   10/27/22: TM at 1.5 mph at 0 incline for 4 min  TM at 1.5 mph at 4 incline for 3 min  Shoulder Horizontal Abduction AROM 1 x 10  Shoulder Horizontal Abduction yellow band 2 x 10  Hip Height Difference: L PSIS >R PSIS  Right Lateral Shift 1 x 10 with 5 sec hold  Wall Push Up with Plus 1 x 10  Lat Pull Down with Green TB 1 x 10     10/19/22: Nu-step with seat at 9 and resistance at 3 for 5 min Wall Occiput Test: 8 inch   Periscapular MMT: -Mid Trap R/L 4/4 -Rhomboid R/L 4/4 -Lower Trap R/L 4-/4- -Shoulder Ext R/L 4/4  Standing Hip Ext with forward lean  1 x 10 Standing Hip Ext without forward lean 1 x 10  Quadruped with hip extension 1 x 10  OMEGA Seated Rows #20 1 x 10  OMEGA Seated Rows #25 1 x 10   OMEGA Seated Rows #35 1 x 10  D2 Flexion/Extension with yellow band 1 x 10   PATIENT EDUCATION:  Education details: Form and technique for correct performance of exercise  Person educated: Patient Education method: Explanation, Demonstration, Verbal cues, and Handouts Education comprehension: verbalized understanding, returned demonstration, and verbal cues required  HOME EXERCISE PROGRAM: Access Code: [email or web address] URL: [email or web address] Date: 12/01/2022 Prepared by: Ellin Goodie  Exercises - Egbert Garibaldi Dog  -  3-4 x weekly - 3 sets - 10 reps - Standing Hip Abduction with Counter  Support  - 3-4 x weekly - 3 sets - 10 reps - Standing Hip Flexion March  - 3-4 x weekly - 3 sets - 10 reps - Prone Quad Stretch with Strap  - 3-4 x weekly - 3 reps - 60 sec  hold - Forward Monster Walks  - 3-4 x weekly - 3-4 sets - 20 reps - Seated Ankle Eversion with Resistance  - 3-4 x weekly - 4 sets - 30 reps   ASSESSMENT:  CLINICAL IMPRESSION: Pt continues to show signs and symptoms of neurogenic claudication with fatigue and heaviness with ambulation. PT focused on balance with additional focus on peroneal activation and peroneal nerve glides to address pt concerns of weakness and EMG results.Pt was successful in these exercises as evidenced by performing them with good form and using primarily ankle strategies during single leg balance. He will continue to benefit from skilled PT to address these aforementioned deficits to improve posture and ability to stand and walk for longer periods of time to continue to maintain independence and well being like walking dog and getting groceries.   OBJECTIVE IMPAIRMENTS: hypomobility, impaired flexibility, postural dysfunction, and pain.   ACTIVITY LIMITATIONS: carrying, lifting, standing, and squatting  PARTICIPATION LIMITATIONS: shopping, community activity, occupation, and yard work  PERSONAL FACTORS: Age, Past/current experiences, and Low back pain are also affecting patient's functional outcome.   REHAB POTENTIAL: Good  CLINICAL DECISION MAKING: Stable/uncomplicated  EVALUATION COMPLEXITY: Low   GOALS: Goals reviewed with patient? No  SHORT TERM GOALS: Target date: 10/31/2022  PT reviewed the following HEP with patient with patient able to demonstrate a set of the following with min cuing for correction needed. PT educated patient on parameters of therex (how/when to inc/decrease intensity, frequency, rep/set range, stretch hold time, and purpose of therex) with verbalized understanding.  Baseline: NT 10/19/22 Performing independently   Goal status: ACHIEVED     LONG TERM GOALS: Target date: 12/26/2022  Patient will have improved function and activity level as evidenced by an increase in FOTO score by 10 points or more.  Baseline: 53 with target of 63 : 11/24/22 = 63 Goal status: ACHIEVED   2.  Patient will demonstrate improved periscapular strength by (1/3 grade MMT i.e 4- to 4) as evidence of improved posture and for offloading of anterior portion of thoracic vertebrae to prevent compression fractures and improve thoracic function.  Baseline: Mid Trap R/L 4/4, Rhomboid R/L 4/4, Lower Trap R/L 4-/4- , Shoulder Ext R/L 4/4  Goal status: DEFERRED   3.  Patient will demonstrate a wall to occiput test value of <2.0 cm as evidence of decreased risk of vertebral compression fracture in the setting of kyphosis.  Baseline: 8 inch (8 inch 11/24/22) Goal status: DEFERRED   4.  Patient will be able to carry out daily activities without >3/10 NRPS in thoracic spine as evidence of improve thoracic function.  Baseline: 4-5/10 NRPS - (0/10 at beginning of day, but increases to 5/10 by end) Goal status: DEFERRED   5. Pt will demonstrate improved hip and knee strength by 1/3 grade MMT (i.e. 4- to 4) to address feelings of weakness and instability when walking and performing ADLs  Baseline:  Hip flexion 4 4  Hip extension 4 4  Hip abduction 4- 4-  Hip adduction 4- 4-  Hip internal rotation    Hip external rotation    Knee flexion 4 4  Knee extension 4 4   Goal status: ONGOING   PLAN:  PT FREQUENCY: 1-2x/week  PT DURATION: 10 weeks  PLANNED INTERVENTIONS: Therapeutic exercises, Therapeutic activity, Neuromuscular re-education, Balance training, Gait training, Patient/Family education, Self Care, Joint mobilization, Joint manipulation, Aquatic Therapy, Dry Needling, Electrical stimulation, Spinal manipulation, Spinal mobilization, Cryotherapy, Moist heat, Traction, Manual therapy, and Re-evaluation.  PLAN FOR NEXT SESSION  Progress hip strengthening exercises by increasing resistance and adding balance components for stability. Incorporate more single leg strength exercises (I.e OMEGA single leg leg press)   Arcelia Jew, Student-PT [employer] DPT Class of 2025

## 2022-12-05 ENCOUNTER — Ambulatory Visit: Payer: Medicare Other | Admitting: Physical Therapy

## 2022-12-07 ENCOUNTER — Ambulatory Visit: Payer: Medicare Other | Admitting: Physical Therapy

## 2022-12-07 DIAGNOSIS — M5459 Other low back pain: Secondary | ICD-10-CM

## 2022-12-07 DIAGNOSIS — G8929 Other chronic pain: Secondary | ICD-10-CM

## 2022-12-07 DIAGNOSIS — M546 Pain in thoracic spine: Secondary | ICD-10-CM | POA: Diagnosis not present

## 2022-12-07 NOTE — Therapy (Signed)
OUTPATIENT PHYSICAL THERAPY THORACOLUMBAR TREATMENT   Patient Name: Karim Mccoig MRN: [id number] DOB:03/04/1933, 89 y.o., male Today's Date: 12/07/2022  END OF SESSION:  PT End of Session - 12/07/22 1434     Visit Number 8    Number of Visits 20    Date for PT Re-Evaluation 12/26/22    Authorization Type Medicare 2024    Authorization - Visit Number 8    Authorization - Number of Visits 20    Progress Note Due on Visit 10    PT Start Time 1032    PT Stop Time 1115    PT Time Calculation (min) 43 min    Activity Tolerance Patient tolerated treatment well    Behavior During Therapy [facility] for tasks assessed/performed                 Past Medical History:  Diagnosis Date   Asthma    Basal cell carcinoma 03/26/2019   Nodular. Glabella. Mohs 07/02/2019   Basal cell carcinoma 02/18/2021   R upper back, EDC   Basal cell carcinoma 08/26/2021   Right preauricular. Nodular and infiltrative. Mohs 12/29/2021   Past Surgical History:  Procedure Laterality Date   APPENDECTOMY     There are no problems to display for this patient.   PCP:   REFERRING PROVIDER: Dr. Cristopher Peru  REFERRING DIAG: [phone] (ICD-10-CM) - Femoral neuropathy of lower extremity, bilateral  Rationale for Evaluation and Treatment: Rehabilitation  THERAPY DIAG:  Other low back pain  Chronic pain of right knee  ONSET DATE: June 2024   SUBJECTIVE:                                                                                                                                                                                           SUBJECTIVE STATEMENT: Feeling pretty good, but has some pain in right knee.   PERTINENT HISTORY:  Pt reports that he had a compression fracture in his thoracic spine (T9) back in early June. He has since had resulting neuropathic pain that radiates around to his chest. He has taken gabapentin for the pain which has helped some, but he now mostly takes it at night. He  experiences most of his pain during afternoon when standing for long periods of time. If he sits for 5 minutes, then the pain decreases. He describes having a history of a right sided TIA where he lost control of his right leg. Pt was referred for neuropathy but this is unrelated to his current mid back pain.   PAIN:  Are you having pain? Yes: NPRS scale: 4-5/10 Pain location: T9  Pain description: Dull  and achy  Aggravating factors: Standing for long periods of time Relieving factors: Sitting down for long periods of time.   PRECAUTIONS: None  RED FLAGS: None   WEIGHT BEARING RESTRICTIONS: No  FALLS:  Has patient fallen in last 6 months? No  LIVING ENVIRONMENT: Lives with: lives with their spouse Lives in: House/apartment Stairs: No Has following equipment at home: None  OCCUPATION: Multimedia programmer and teaches classes for [employer]   PLOF: Independent  PATIENT GOALS: Decrease mid back pain and improve posture.   NEXT MD VISIT: November 2024   OBJECTIVE:  Note: Objective measures were completed at Evaluation unless otherwise noted.  VITALS: BP 167/82 HR 58 SpO2 98%   DIAGNOSTIC FINDINGS:  CLINICAL DATA:  Thoracic region back pain with radiation around the sides.   EXAM: MRI THORACIC SPINE WITHOUT CONTRAST   TECHNIQUE: Multiplanar, multisequence MR imaging of the thoracic spine was performed. No intravenous contrast was administered.   COMPARISON:  Chest CT 08/20/2018   FINDINGS: Alignment:  Mild scoliotic curvature.   Vertebrae: Acute or subacute inferior endplate fracture at T9 with loss of height of 20%. No retropulsed bone. Old healed superior endplate fracture at T12, unchanged since a chest CT in 2020.   Benign appearing hemangioma within the T7 vertebral body.   Cord:  No cord compression or focal cord lesion.   Paraspinal and other soft tissues: Negative except for a hiatal hernia.   Disc levels:   No significant finding from T1-2 through T5-6.    T6-7: Small central disc protrusion indents the thecal sac slightly but does not cause any neural compression.   T7-8 and T8-9: No disc abnormality or significant stenosis. 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   Inferior endplate compression fracture at T9 as noted above with marrow edema. Bilateral facet joint effusions and posterior element edema. No compressive stenosis of the central canal. Bilateral foraminal stenosis that could affect either or both T9 nerves. Findings could certainly relate to regional back pain. No finding to suggest that this is anything other than a benign fracture. The T9-10 disc bulges minimally.   At T10, there is some edema within the right pedicle, probably either deriving from the facet arthropathy or indicating a stress phenomenon. No vertebral body compression fracture.   T10-11: Mild bulging of the disc towards the left. Bilateral facet degeneration. No compressive stenosis.   T11-12: No disc abnormality. Bilateral facet degeneration and hypertrophy, worse on the right. No compressive stenosis.   T12-L1: Mild bulging of the disc.  No compressive stenosis.   IMPRESSION: 1. Acute or subacute inferior endplate fracture at T9 with loss of height of 20%. No retropulsed bone. This could be a cause of regional back pain. 2. 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 Bilateral foraminal stenosis that could affect either or both T9 nerves. Findings could also relate to regional back pain. 3. Old healed superior endplate fracture at T12, unchanged since 2020. 4. Edema within the right pedicle at T10, probably either deriving from the facet arthropathy or indicating a stress phenomenon. 5. 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     Electronically Signed   By: Paulina Fusi M.D.   On: 08/02/2022 10:16  PATIENT SURVEYS:  FOTO 53/100 with target of 64    SCREENING FOR RED FLAGS: Bowel or bladder incontinence: No Spinal tumors: No Cauda equina syndrome: No Compression fracture: No Abdominal aneurysm: No  COGNITION: Overall cognitive status: Within functional limits for  tasks assessed     SENSATION: Numbness and tingling in bilateral feet that do not radiate up to low back   MUSCLE LENGTH: Hamstrings: Right 90 deg; Left 90 deg Thomas test: Negative bilaterally   POSTURE: rounded shoulders and forward head  PALPATION:  No tenderness to palpation   UE ROM:     Active ROM Right eval Left eval  Shoulder flexion    Shoulder extension    Shoulder abduction    Shoulder adduction    Shoulder internal rotation    Shoulder external rotation    Elbow flexion    Elbow extension    Wrist flexion    Wrist extension    Wrist ulnar deviation    Wrist radial deviation    Wrist pronation    Wrist supination    (Blank rows = not tested)        UE MMT:   Active ROM Right eval Left eval  Shoulder flexion 4+ 4+  Shoulder extension    Shoulder abduction 4+ 4+  Shoulder adduction    Shoulder internal rotation 4 4  Shoulder external rotation 4 4  Mid Trap  NT  NT   Lower Trap  NT  NT  Elbow flexion    Elbow extension    Wrist flexion    Wrist extension    Wrist ulnar deviation    Wrist radial deviation    Wrist pronation    Wrist supination    (Blank rows = not tested)   LUMBAR ROM:   AROM eval  Flexion 100%  Extension 100%  Right lateral flexion 100%  Left lateral flexion 100%  Right rotation 100%  Left rotation 100%   (Blank rows = not tested)  LOWER EXTREMITY ROM:     Active  Right eval Left eval  Hip flexion    Hip extension    Hip abduction    Hip adduction    Hip internal rotation    Hip external rotation    Knee flexion    Knee extension    Ankle dorsiflexion    Ankle plantarflexion    Ankle inversion    Ankle eversion     (Blank rows = not tested)  LOWER EXTREMITY MMT:    MMT  Right eval Left eval  Hip flexion 4 4  Hip extension 4 4  Hip abduction 4- 4-  Hip adduction 4- 4-  Hip internal rotation    Hip external rotation    Knee flexion 4 4  Knee extension 4 4  Ankle dorsiflexion 4 4  Ankle plantarflexion    Ankle inversion    Ankle eversion     (Blank rows = not tested)  LUMBAR SPECIAL TESTS:  Straight leg raise test: Negative, FABER test: Negative, Thomas test: Negative, and FADIR Negative   FUNCTIONAL TESTS:  None performed   GAIT: Distance walked: 50 ft  Assistive device utilized: None Level of assistance: Complete Independence Comments: Decreased stride length and step length bilaterally  TODAY'S TREATMENT:  DATE:  12/07/22: Nu-Step seat and arms at 9 with resistance at 3 for 7 min Blaze pod taps with red theraband around feet 3x1:00  - 2 pods on wall, 2 on floor  - min cuing to tap all pods with bottom of foot Blazepod home base sqaure with side shuffle 3x1:00  - high score 10  - HR 105 (79% predicted HR max)   11/25/22: Nu-Step seat and arms at 9 with resistance at 3 for 5 min Resisted eversion with green band around balls of feet - 1x30 per side Peroneal nerve glides 2x10 per side Single leg balance on foam pad with blaze pods    11/24/22: Nu-Step seat and arms at 9 with resistance at 3 for 5 min  FOTO - low back - 63 with 64 expected Wall to occiput - 8 in Monster walks with blue band around ankles - 2x55m  - min cuing to lift foot on eccentric phase Monster walks with green band and bolster step overs - 4x69m  - min cuing for wider steps over bolster Mini squats with 10# and blue abduction band around knees with 10# weight 2x10  - slight pain in knees Standing marches with 5# AW's and red band around feet 3x20  - min cuing to raise knees to 90 deg flexion   11/14/22: Nu-Step seat and arms at 9 with  resistance at 3 for 5 min  Ambulation with rollator 10x around OMEGA machine   Ely's Test: Positive  Prone Quad Stretch 2 x 60 sec  Hip MMT  -Flex R/L 4+/4 -Abd R/L 4/4 -Add R/L 4/4  -Ext R/L 4/4  Standing Hip Abduction with 1 UE support 2 x 10 Standing Hip Abduction with BUE support with #5 AW 1 x 10  Standing Hip Abduction with no UE support with #5 AW 1 x 10  Standing Hip Flexion #5 AW with no UE support 2 x 10    10/31/22: TM at 2.3 mph for 6 min  OMEGA Lat Pull Down Machine #20 1 x 10  OMEGA Lat Pull Down Machine #35 1 x 10  OMEGA Lat Pull Down Machine #45  1 x 10  Corner Pec Stretch 2 x 30 sec  Serratus Wall Slides with pillow case 1 x 10  Bent over horizontal abduction #1 1 x 10  Bent over horizontal abduction #2 1 x 10  Bent over horizontal abduction #3 1 x 10  Bird Dog Dogs 3 x 10   10/27/22: TM at 1.5 mph at 0 incline for 4 min  TM at 1.5 mph at 4 incline for 3 min  Shoulder Horizontal Abduction AROM 1 x 10  Shoulder Horizontal Abduction yellow band 2 x 10  Hip Height Difference: L PSIS >R PSIS  Right Lateral Shift 1 x 10 with 5 sec hold  Wall Push Up with Plus 1 x 10  Lat Pull Down with Green TB 1 x 10     10/19/22: Nu-step with seat at 9 and resistance at 3 for 5 min Wall Occiput Test: 8 inch   Periscapular MMT: -Mid Trap R/L 4/4 -Rhomboid R/L 4/4 -Lower Trap R/L 4-/4- -Shoulder Ext R/L 4/4  Standing Hip Ext with forward lean  1 x 10 Standing Hip Ext without forward lean 1 x 10  Quadruped with hip extension 1 x 10  OMEGA Seated Rows #20 1 x 10  OMEGA Seated Rows #25 1 x 10   OMEGA Seated Rows #35 1 x 10  D2 Flexion/Extension  with yellow band 1 x 10   PATIENT EDUCATION:  Education details: Form and technique for correct performance of exercise  Person educated: Patient Education method: Explanation, Demonstration, Verbal cues, and Handouts Education comprehension: verbalized understanding, returned demonstration, and verbal cues required  HOME  EXERCISE PROGRAM: Access Code: [email or web address] URL: [email or web address] Date: 12/01/2022 Prepared by: Ellin Goodie  Exercises - Bird Dog  - 3-4 x weekly - 3 sets - 10 reps - Standing Hip Abduction with Counter Support  - 3-4 x weekly - 3 sets - 10 reps - Standing Hip Flexion March  - 3-4 x weekly - 3 sets - 10 reps - Prone Quad Stretch with Strap  - 3-4 x weekly - 3 reps - 60 sec  hold - Forward Monster Walks  - 3-4 x weekly - 3-4 sets - 20 reps - Seated Ankle Eversion with Resistance  - 3-4 x weekly - 4 sets - 30 reps   ASSESSMENT:  CLINICAL IMPRESSION:  Pt continues to show signs and symptoms of neurogenic claudication with fatigue and heaviness with ambulation. PT focused on high intensity hip ankle musculature endurance and strength training. Pt was able perform all exercises with good form and work at 79% of predicted max HR to increase LE strength and activity tolerance and progress towards therapy goals. He will continue to benefit from skilled PT to address these aforementioned deficits to improve posture and ability to stand and walk for longer periods of time to continue to maintain independence and well being like walking dog and getting groceries.   OBJECTIVE IMPAIRMENTS: hypomobility, impaired flexibility, postural dysfunction, and pain.   ACTIVITY LIMITATIONS: carrying, lifting, standing, and squatting  PARTICIPATION LIMITATIONS: shopping, community activity, occupation, and yard work  PERSONAL FACTORS: Age, Past/current experiences, and Low back pain are also affecting patient's functional outcome.   REHAB POTENTIAL: Good  CLINICAL DECISION MAKING: Stable/uncomplicated  EVALUATION COMPLEXITY: Low   GOALS: Goals reviewed with patient? No  SHORT TERM GOALS: Target date: 10/31/2022  PT reviewed the following HEP with patient with patient able to demonstrate a set of the following with min cuing for correction needed. PT educated patient on parameters of  therex (how/when to inc/decrease intensity, frequency, rep/set range, stretch hold time, and purpose of therex) with verbalized understanding.  Baseline: NT 10/19/22 Performing independently  Goal status: ACHIEVED     LONG TERM GOALS: Target date: 12/26/2022  Patient will have improved function and activity level as evidenced by an increase in FOTO score by 10 points or more.  Baseline: 53 with target of 63 : 11/24/22 = 63 Goal status: ACHIEVED   2.  Patient will demonstrate improved periscapular strength by (1/3 grade MMT i.e 4- to 4) as evidence of improved posture and for offloading of anterior portion of thoracic vertebrae to prevent compression fractures and improve thoracic function.  Baseline: Mid Trap R/L 4/4, Rhomboid R/L 4/4, Lower Trap R/L 4-/4- , Shoulder Ext R/L 4/4  Goal status: DEFERRED   3.  Patient will demonstrate a wall to occiput test value of <2.0 cm as evidence of decreased risk of vertebral compression fracture in the setting of kyphosis.  Baseline: 8 inch (8 inch 11/24/22) Goal status: DEFERRED   4.  Patient will be able to carry out daily activities without >3/10 NRPS in thoracic spine as evidence of improve thoracic function.  Baseline: 4-5/10 NRPS - (0/10 at beginning of day, but increases to 5/10 by end) Goal status: DEFERRED   5. Pt will demonstrate improved hip and  knee strength by 1/3 grade MMT (i.e. 4- to 4) to address feelings of weakness and instability when walking and performing ADLs  Baseline:  Hip flexion 4 4  Hip extension 4 4  Hip abduction 4- 4-  Hip adduction 4- 4-  Hip internal rotation    Hip external rotation    Knee flexion 4 4  Knee extension 4 4   Goal status: ONGOING  PLAN:  PT FREQUENCY: 1-2x/week  PT DURATION: 10 weeks  PLANNED INTERVENTIONS: Therapeutic exercises, Therapeutic activity, Neuromuscular re-education, Balance training, Gait training, Patient/Family education, Self Care, Joint mobilization, Joint manipulation,  Aquatic Therapy, Dry Needling, Electrical stimulation, Spinal manipulation, Spinal mobilization, Cryotherapy, Moist heat, Traction, Manual therapy, and Re-evaluation.  PLAN FOR NEXT SESSION Progress hip strengthening exercises by increasing resistance and adding balance components for stability. Incorporate more single leg strength exercises (I.e OMEGA single leg leg press)   Arcelia Jew, Student-PT [employer] DPT Class of 2025

## 2022-12-13 ENCOUNTER — Ambulatory Visit: Payer: Medicare Other | Admitting: Physical Therapy

## 2022-12-15 ENCOUNTER — Encounter: Payer: Medicare Other | Admitting: Physical Therapy

## 2022-12-19 ENCOUNTER — Ambulatory Visit: Payer: Medicare Other | Attending: Neurology | Admitting: Physical Therapy

## 2022-12-19 DIAGNOSIS — M25561 Pain in right knee: Secondary | ICD-10-CM | POA: Insufficient documentation

## 2022-12-19 DIAGNOSIS — M546 Pain in thoracic spine: Secondary | ICD-10-CM | POA: Insufficient documentation

## 2022-12-19 DIAGNOSIS — G8929 Other chronic pain: Secondary | ICD-10-CM | POA: Diagnosis present

## 2022-12-19 DIAGNOSIS — M5459 Other low back pain: Secondary | ICD-10-CM | POA: Insufficient documentation

## 2022-12-19 NOTE — Therapy (Addendum)
OUTPATIENT PHYSICAL THERAPY THORACOLUMBAR TREATMENT/ Recertification    Patient Name: Patrick Wu MRN: [id number] DOB:[birth year], 89 y.o., male Today's Date: 12/19/2022  END OF SESSION:  PT End of Session - 12/19/22 1739     Visit Number 9    Number of Visits 20    Date for PT Re-Evaluation 12/26/22    Authorization Type Medicare 2024    Authorization - Visit Number 9    Authorization - Number of Visits 20    Progress Note Due on Visit 10    PT Start Time 0445    PT Stop Time 0528    PT Time Calculation (min) 43 min    Activity Tolerance Patient tolerated treatment well    Behavior During Therapy [facility] for tasks assessed/performed                  Past Medical History:  Diagnosis Date   Asthma    Basal cell carcinoma 03/26/2019   Nodular. Glabella. Mohs 07/02/2019   Basal cell carcinoma 02/18/2021   R upper back, EDC   Basal cell carcinoma 08/26/2021   Right preauricular. Nodular and infiltrative. Mohs 12/29/2021   Past Surgical History:  Procedure Laterality Date   APPENDECTOMY     There are no problems to display for this patient.   PCP:   REFERRING PROVIDER: Dr. Cristopher Peru  REFERRING DIAG: [phone] (ICD-10-CM) - Femoral neuropathy of lower extremity, bilateral  Rationale for Evaluation and Treatment: Rehabilitation  THERAPY DIAG:  Other low back pain  Pain in thoracic spine  Chronic pain of right knee  ONSET DATE: June 2024   SUBJECTIVE:                                                                                                                                                                                           SUBJECTIVE STATEMENT: 0/10 pain right now. Reports high pain after last session; thinks it is due to the balance and twisting   PERTINENT HISTORY:  Pt reports that he had a compression fracture in his thoracic spine (T9) back in early June. He has since had resulting neuropathic pain that radiates around to his chest. He  has taken gabapentin for the pain which has helped some, but he now mostly takes it at night. He experiences most of his pain during afternoon when standing for long periods of time. If he sits for 5 minutes, then the pain decreases. He describes having a history of a right sided TIA where he lost control of his right leg. Pt was referred for neuropathy but this is unrelated to his current mid back pain.  PAIN:  Are you having pain? Yes: NPRS scale: 4-5/10 Pain location: T9  Pain description: Dull and achy  Aggravating factors: Standing for long periods of time Relieving factors: Sitting down for long periods of time.   PRECAUTIONS: None  RED FLAGS: None   WEIGHT BEARING RESTRICTIONS: No  FALLS:  Has patient fallen in last 6 months? No  LIVING ENVIRONMENT: Lives with: lives with their spouse Lives in: House/apartment Stairs: No Has following equipment at home: None  OCCUPATION: Multimedia programmer and teaches classes for [employer]   PLOF: Independent  PATIENT GOALS: Decrease mid back pain and improve posture.   NEXT MD VISIT: November 2024   OBJECTIVE:  Note: Objective measures were completed at Evaluation unless otherwise noted.  VITALS: BP 167/82 HR 58 SpO2 98%   DIAGNOSTIC FINDINGS:  CLINICAL DATA:  Thoracic region back pain with radiation around the sides.   EXAM: MRI THORACIC SPINE WITHOUT CONTRAST   TECHNIQUE: Multiplanar, multisequence MR imaging of the thoracic spine was performed. No intravenous contrast was administered.   COMPARISON:  Chest CT 08/20/2018   FINDINGS: Alignment:  Mild scoliotic curvature.   Vertebrae: Acute or subacute inferior endplate fracture at T9 with loss of height of 20%. No retropulsed bone. Old healed superior endplate fracture at T12, unchanged since a chest CT in 2020.   Benign appearing hemangioma within the T7 vertebral body.   Cord:  No cord compression or focal cord lesion.   Paraspinal and other soft tissues: Negative  except for a hiatal hernia.   Disc levels:   No significant finding from T1-2 through T5-6.   T6-7: Small central disc protrusion indents the thecal sac slightly but does not cause any neural compression.   T7-8 and T8-9: No disc abnormality or significant stenosis. 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   Inferior endplate compression fracture at T9 as noted above with marrow edema. Bilateral facet joint effusions and posterior element edema. No compressive stenosis of the central canal. Bilateral foraminal stenosis that could affect either or both T9 nerves. Findings could certainly relate to regional back pain. No finding to suggest that this is anything other than a benign fracture. The T9-10 disc bulges minimally.   At T10, there is some edema within the right pedicle, probably either deriving from the facet arthropathy or indicating a stress phenomenon. No vertebral body compression fracture.   T10-11: Mild bulging of the disc towards the left. Bilateral facet degeneration. No compressive stenosis.   T11-12: No disc abnormality. Bilateral facet degeneration and hypertrophy, worse on the right. No compressive stenosis.   T12-L1: Mild bulging of the disc.  No compressive stenosis.   IMPRESSION: 1. Acute or subacute inferior endplate fracture at T9 with loss of height of 20%. No retropulsed bone. This could be a cause of regional back pain. 2. 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 Bilateral foraminal stenosis that could affect either or both T9 nerves. Findings could also relate to regional back pain. 3. Old healed superior endplate fracture at T12, unchanged since 2020. 4. Edema within the right pedicle at T10, probably either deriving from the facet arthropathy or indicating a stress phenomenon. 5. 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     Electronically Signed   By: Paulina Fusi M.D.   On: 08/02/2022 10:16  PATIENT SURVEYS:  FOTO 53/100 with target of 64   SCREENING FOR RED FLAGS: Bowel or bladder incontinence: No Spinal tumors: No Cauda equina  syndrome: No Compression fracture: No Abdominal aneurysm: No  COGNITION: Overall cognitive status: Within functional limits for tasks assessed     SENSATION: Numbness and tingling in bilateral feet that do not radiate up to low back   MUSCLE LENGTH: Hamstrings: Right 90 deg; Left 90 deg Thomas test: Negative bilaterally   POSTURE: rounded shoulders and forward head  PALPATION:  No tenderness to palpation   UE ROM:     Active ROM Right eval Left eval  Shoulder flexion    Shoulder extension    Shoulder abduction    Shoulder adduction    Shoulder internal rotation    Shoulder external rotation    Elbow flexion    Elbow extension    Wrist flexion    Wrist extension    Wrist ulnar deviation    Wrist radial deviation    Wrist pronation    Wrist supination    (Blank rows = not tested)        UE MMT:   Active ROM Right eval Left eval  Shoulder flexion 4+ 4+  Shoulder extension    Shoulder abduction 4+ 4+  Shoulder adduction    Shoulder internal rotation 4 4  Shoulder external rotation 4 4  Mid Trap  NT  NT   Lower Trap  NT  NT  Elbow flexion    Elbow extension    Wrist flexion    Wrist extension    Wrist ulnar deviation    Wrist radial deviation    Wrist pronation    Wrist supination    (Blank rows = not tested)   LUMBAR ROM:   AROM eval  Flexion 100%  Extension 100%  Right lateral flexion 100%  Left lateral flexion 100%  Right rotation 100%  Left rotation 100%   (Blank rows = not tested)  LOWER EXTREMITY ROM:     Active  Right eval Left eval  Hip flexion    Hip extension    Hip abduction    Hip adduction    Hip internal rotation    Hip external rotation    Knee flexion    Knee extension    Ankle dorsiflexion    Ankle plantarflexion    Ankle  inversion    Ankle eversion     (Blank rows = not tested)  LOWER EXTREMITY MMT:    MMT Right eval Left eval  Hip flexion 4 4  Hip extension 4 4  Hip abduction 4- 4-  Hip adduction 4- 4-  Hip internal rotation    Hip external rotation    Knee flexion 4 4  Knee extension 4 4  Ankle dorsiflexion 4 4  Ankle plantarflexion    Ankle inversion    Ankle eversion     (Blank rows = not tested)  LUMBAR SPECIAL TESTS:  Straight leg raise test: Negative, FABER test: Negative, Thomas test: Negative, and FADIR Negative   FUNCTIONAL TESTS:  None performed   GAIT: Distance walked: 50 ft  Assistive device utilized: None Level of assistance: Complete Independence Comments: Decreased stride length and step length bilaterally  TODAY'S TREATMENT:  DATE:  12/19/22: Nu-Step seat and arms at 9 with resistance at 3 for 5 min Monster walks blue band around midfoot for 2x90m  - HR 94 (70% predicted max) OMEGA pallof press - 2x12 with 5 sec hold per side @12 #  - 9/10 on RPE Kneeling resisted trunk rotations with green theraband 1x1:00 Standing resisted trunk rotations with green theraband 1x1:00  - currently moves pelvis and spine together, which is ok - but can progress to moving them seperately  12/07/22: Nu-Step seat and arms at 9 with resistance at 3 for 7 min Blaze pod taps with red theraband around feet 3x1:00  - 2 pods on wall, 2 on floor  - min cuing to tap all pods with bottom of foot Blazepod home base sqaure with side shuffle 3x1:00  - high score 10  - HR 105 (79% predicted HR max)    11/25/22: Nu-Step seat and arms at 9 with resistance at 3 for 5 min Resisted eversion with green band around balls of feet - 1x30 per side Peroneal nerve glides 2x10 per side Single leg balance on foam pad with blaze pods    11/24/22: Nu-Step seat and arms at 9 with  resistance at 3 for 5 min  FOTO - low back - 63 with 64 expected Wall to occiput - 8 in Monster walks with blue band around ankles - 2x57m  - min cuing to lift foot on eccentric phase Monster walks with green band and bolster step overs - 4x48m  - min cuing for wider steps over bolster Mini squats with 10# and blue abduction band around knees with 10# weight 2x10  - slight pain in knees Standing marches with 5# AW's and red band around feet 3x20  - min cuing to raise knees to 90 deg flexion   11/14/22: Nu-Step seat and arms at 9 with resistance at 3 for 5 min  Ambulation with rollator 10x around OMEGA machine   Ely's Test: Positive  Prone Quad Stretch 2 x 60 sec  Hip MMT  -Flex R/L 4+/4 -Abd R/L 4/4 -Add R/L 4/4  -Ext R/L 4/4  Standing Hip Abduction with 1 UE support 2 x 10 Standing Hip Abduction with BUE support with #5 AW 1 x 10  Standing Hip Abduction with no UE support with #5 AW 1 x 10  Standing Hip Flexion #5 AW with no UE support 2 x 10    10/31/22: TM at 2.3 mph for 6 min  OMEGA Lat Pull Down Machine #20 1 x 10  OMEGA Lat Pull Down Machine #35 1 x 10  OMEGA Lat Pull Down Machine #45  1 x 10  Corner Pec Stretch 2 x 30 sec  Serratus Wall Slides with pillow case 1 x 10  Bent over horizontal abduction #1 1 x 10  Bent over horizontal abduction #2 1 x 10  Bent over horizontal abduction #3 1 x 10  Bird Dog Dogs 3 x 10   10/27/22: TM at 1.5 mph at 0 incline for 4 min  TM at 1.5 mph at 4 incline for 3 min  Shoulder Horizontal Abduction AROM 1 x 10  Shoulder Horizontal Abduction yellow band 2 x 10  Hip Height Difference: L PSIS >R PSIS  Right Lateral Shift 1 x 10 with 5 sec hold  Wall Push Up with Plus 1 x 10  Lat Pull Down with Green TB 1 x 10     10/19/22: Nu-step with seat at 9 and resistance at 3  for 5 min Wall Occiput Test: 8 inch   Periscapular MMT: -Mid Trap R/L 4/4 -Rhomboid R/L 4/4 -Lower Trap R/L 4-/4- -Shoulder Ext R/L 4/4  Standing Hip Ext with  forward lean  1 x 10 Standing Hip Ext without forward lean 1 x 10  Quadruped with hip extension 1 x 10  OMEGA Seated Rows #20 1 x 10  OMEGA Seated Rows #25 1 x 10   OMEGA Seated Rows #35 1 x 10  D2 Flexion/Extension with yellow band 1 x 10   PATIENT EDUCATION:  Education details: Form and technique for correct performance of exercise  Person educated: Patient Education method: Explanation, Demonstration, Verbal cues, and Handouts Education comprehension: verbalized understanding, returned demonstration, and verbal cues required  HOME EXERCISE PROGRAM: Access Code: [email or web address] URL: [email or web address] Date: 12/01/2022 Prepared by: Ellin Goodie  Exercises - Bird Dog  - 3-4 x weekly - 3 sets - 10 reps - Standing Hip Abduction with Counter Support  - 3-4 x weekly - 3 sets - 10 reps - Standing Hip Flexion March  - 3-4 x weekly - 3 sets - 10 reps - Prone Quad Stretch with Strap  - 3-4 x weekly - 3 reps - 60 sec  hold - Forward Monster Walks  - 3-4 x weekly - 3-4 sets - 20 reps - Seated Ankle Eversion with Resistance  - 3-4 x weekly - 4 sets - 30 reps   ASSESSMENT:  CLINICAL IMPRESSION:  Pt continues to show signs and symptoms of neurogenic claudication with fatigue and heaviness with ambulation. PT focused on high intensity hip and core strengthening to increase stability when walking and performing ADLs. Pt successfully performed all exercises at RPE >6/10 with good form and without increase in pain showing progress towards goals. He will continue to benefit from skilled PT to address these aforementioned deficits to improve posture and ability to stand and walk for longer periods of time to continue to maintain independence and well being like walking dog and getting groceries.   OBJECTIVE IMPAIRMENTS: hypomobility, impaired flexibility, postural dysfunction, and pain.   ACTIVITY LIMITATIONS: carrying, lifting, standing, and squatting  PARTICIPATION LIMITATIONS:  shopping, community activity, occupation, and yard work  PERSONAL FACTORS: Age, Past/current experiences, and Low back pain are also affecting patient's functional outcome.   REHAB POTENTIAL: Good  CLINICAL DECISION MAKING: Stable/uncomplicated  EVALUATION COMPLEXITY: Low   GOALS: Goals reviewed with patient? No  SHORT TERM GOALS: Target date: 10/31/2022  PT reviewed the following HEP with patient with patient able to demonstrate a set of the following with min cuing for correction needed. PT educated patient on parameters of therex (how/when to inc/decrease intensity, frequency, rep/set range, stretch hold time, and purpose of therex) with verbalized understanding.  Baseline: NT 10/19/22 Performing independently  Goal status: ACHIEVED     LONG TERM GOALS: Target date: 12/26/2022  Patient will have improved function and activity level as evidenced by an increase in FOTO score by 10 points or more.  Baseline: 53 with target of 63 : 11/24/22 = 63 Goal status: ACHIEVED   2.  Patient will demonstrate improved periscapular strength by (1/3 grade MMT i.e 4- to 4) as evidence of improved posture and for offloading of anterior portion of thoracic vertebrae to prevent compression fractures and improve thoracic function.  Baseline: Mid Trap R/L 4/4, Rhomboid R/L 4/4, Lower Trap R/L 4-/4- , Shoulder Ext R/L 4/4  Goal status: DEFERRED   3.  Patient will demonstrate a wall to occiput test value of <  2.0 cm as evidence of decreased risk of vertebral compression fracture in the setting of kyphosis.  Baseline: 8 inch (8 inch 11/24/22) Goal status: DEFERRED   4.  Patient will be able to carry out daily activities without >3/10 NRPS in thoracic spine as evidence of improve thoracic function.  Baseline: 4-5/10 NRPS - (0/10 at beginning of day, but increases to 5/10 by end) Goal status: DEFERRED   5. Pt will demonstrate improved hip and knee strength by 1/3 grade MMT (i.e. 4- to 4) to address  feelings of weakness and instability when walking and performing ADLs  Baseline:  Hip flexion 4 4  Hip extension 4 4  Hip abduction 4- 4-  Hip adduction 4- 4-  Hip internal rotation    Hip external rotation    Knee flexion 4 4  Knee extension 4 4   Goal status: ONGOING  PLAN:  PT FREQUENCY: 1-2x/week  PT DURATION: 10 weeks  PLANNED INTERVENTIONS: Therapeutic exercises, Therapeutic activity, Neuromuscular re-education, Balance training, Gait training, Patient/Family education, Self Care, Joint mobilization, Joint manipulation, Aquatic Therapy, Dry Needling, Electrical stimulation, Spinal manipulation, Spinal mobilization, Cryotherapy, Moist heat, Traction, Manual therapy, and Re-evaluation.  PLAN FOR NEXT SESSION Progress hip strengthening exercises by increasing resistance and adding balance components for stability. Incorporate more single leg strength exercises (I.e OMEGA single leg leg press). Program more rotational/anti-rotational movements like pallof and OMEGA rotations   [address] Student-PT [employer] of 2025

## 2022-12-21 ENCOUNTER — Encounter: Payer: Medicare Other | Admitting: Physical Therapy

## 2022-12-27 ENCOUNTER — Ambulatory Visit: Payer: Medicare Other | Admitting: Physical Therapy

## 2022-12-27 DIAGNOSIS — M5459 Other low back pain: Secondary | ICD-10-CM

## 2022-12-27 DIAGNOSIS — M546 Pain in thoracic spine: Secondary | ICD-10-CM

## 2022-12-27 NOTE — Addendum Note (Signed)
Addended by: Johnn Hai on: 12/27/2022 02:40 PM   Modules accepted: Orders

## 2022-12-27 NOTE — Therapy (Addendum)
OUTPATIENT PHYSICAL THERAPY THORACOLUMBAR PROGRESS  Dates of Reporting: 10/17/22-12/27/23  Patient Name: Patrick Wu MRN: [id number] DOB:Apr 04, 1933, 89 y.o., male Today's Date: 12/27/2022  END OF SESSION:  PT End of Session - 12/27/22 1351     Visit Number 10    Number of Visits 20    Date for PT Re-Evaluation 02/27/23    Authorization Type Medicare 2024    Authorization - Visit Number 10    Authorization - Number of Visits 20    Progress Note Due on Visit 10    PT Start Time 1345    PT Stop Time 1430    PT Time Calculation (min) 45 min    Activity Tolerance Patient tolerated treatment well    Behavior During Therapy [facility] for tasks assessed/performed                  Past Medical History:  Diagnosis Date   Asthma    Basal cell carcinoma 03/26/2019   Nodular. Glabella. Mohs 07/02/2019   Basal cell carcinoma 02/18/2021   R upper back, [facility]   Basal cell carcinoma 08/26/2021   Right preauricular. Nodular and infiltrative. Mohs 12/29/2021   Past Surgical History:  Procedure Laterality Date   APPENDECTOMY     There are no active problems to display for this patient.   PCP:   REFERRING PROVIDER: Dr. Cristopher Peru  REFERRING DIAG: [phone] (ICD-10-CM) - Femoral neuropathy of lower extremity, bilateral  Rationale for Evaluation and Treatment: Rehabilitation  THERAPY DIAG:  Other low back pain  Pain in thoracic spine  ONSET DATE: June 2024   SUBJECTIVE:                                                                                                                                                                                           SUBJECTIVE STATEMENT: Pt reports that his back pain has not changed much and that he continues to perform exercises without much difficulty.   PERTINENT HISTORY:  Pt reports that he had a compression fracture in his thoracic spine (T9) back in early June. He has since had resulting neuropathic pain that radiates around to  his chest. He has taken gabapentin for the pain which has helped some, but he now mostly takes it at night. He experiences most of his pain during afternoon when standing for long periods of time. If he sits for 5 minutes, then the pain decreases. He describes having a history of a right sided TIA where he lost control of his right leg. Pt was referred for neuropathy but this is unrelated to his current mid back pain.   PAIN:  Are you having pain? Yes: NPRS scale: 4-5/10 Pain location: T9  Pain description: Dull and achy  Aggravating factors: Standing for long periods of time Relieving factors: Sitting down for long periods of time.   PRECAUTIONS: None  RED FLAGS: None   WEIGHT BEARING RESTRICTIONS: No  FALLS:  Has patient fallen in last 6 months? No  LIVING ENVIRONMENT: Lives with: lives with their spouse Lives in: House/apartment Stairs: No Has following equipment at home: None  OCCUPATION: Multimedia programmer and teaches classes for [employer]   PLOF: Independent  PATIENT GOALS: Decrease mid back pain and improve posture.   NEXT MD VISIT: November 2024   OBJECTIVE:  Note: Objective measures were completed at Evaluation unless otherwise noted.  VITALS: BP 167/82 HR 58 SpO2 98%   DIAGNOSTIC FINDINGS:  CLINICAL DATA:  Thoracic region back pain with radiation around the sides.   EXAM: MRI THORACIC SPINE WITHOUT CONTRAST   TECHNIQUE: Multiplanar, multisequence MR imaging of the thoracic spine was performed. No intravenous contrast was administered.   COMPARISON:  Chest CT 08/20/2018   FINDINGS: Alignment:  Mild scoliotic curvature.   Vertebrae: Acute or subacute inferior endplate fracture at T9 with loss of height of 20%. No retropulsed bone. Old healed superior endplate fracture at T12, unchanged since a chest CT in 2020.   Benign appearing hemangioma within the T7 vertebral body.   Cord:  No cord compression or focal cord lesion.   Paraspinal and other soft  tissues: Negative except for a hiatal hernia.   Disc levels:   No significant finding from T1-2 through T5-6.   T6-7: Small central disc protrusion indents the thecal sac slightly but does not cause any neural compression.   T7-8 and T8-9: No disc abnormality or significant stenosis. 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   Inferior endplate compression fracture at T9 as noted above with marrow edema. Bilateral facet joint effusions and posterior element edema. No compressive stenosis of the central canal. Bilateral foraminal stenosis that could affect either or both T9 nerves. Findings could certainly relate to regional back pain. No finding to suggest that this is anything other than a benign fracture. The T9-10 disc bulges minimally.   At T10, there is some edema within the right pedicle, probably either deriving from the facet arthropathy or indicating a stress phenomenon. No vertebral body compression fracture.   T10-11: Mild bulging of the disc towards the left. Bilateral facet degeneration. No compressive stenosis.   T11-12: No disc abnormality. Bilateral facet degeneration and hypertrophy, worse on the right. No compressive stenosis.   T12-L1: Mild bulging of the disc.  No compressive stenosis.   IMPRESSION: 1. Acute or subacute inferior endplate fracture at T9 with loss of height of 20%. No retropulsed bone. This could be a cause of regional back pain. 2. 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 Bilateral foraminal stenosis that could affect either or both T9 nerves. Findings could also relate to regional back pain. 3. Old healed superior endplate fracture at T12, unchanged since 2020. 4. Edema within the right pedicle at T10, probably either deriving from the facet arthropathy or indicating a stress phenomenon. 5. 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     Electronically  Signed   By: Paulina Fusi M.D.   On: 08/02/2022 10:16  PATIENT SURVEYS:  FOTO 53/100 with target of 64   SCREENING FOR RED FLAGS: Bowel or bladder incontinence: No Spinal tumors: No Cauda equina syndrome: No  Compression fracture: No Abdominal aneurysm: No  COGNITION: Overall cognitive status: Within functional limits for tasks assessed     SENSATION: Numbness and tingling in bilateral feet that do not radiate up to low back   MUSCLE LENGTH: Hamstrings: Right 90 deg; Left 90 deg Thomas test: Negative bilaterally   POSTURE: rounded shoulders and forward head  PALPATION:  No tenderness to palpation   UE ROM:     Active ROM Right eval Left eval  Shoulder flexion    Shoulder extension    Shoulder abduction    Shoulder adduction    Shoulder internal rotation    Shoulder external rotation    Elbow flexion    Elbow extension    Wrist flexion    Wrist extension    Wrist ulnar deviation    Wrist radial deviation    Wrist pronation    Wrist supination    (Blank rows = not tested)        UE MMT:   Active ROM Right eval Left eval  Shoulder flexion 4+ 4+  Shoulder extension    Shoulder abduction 4+ 4+  Shoulder adduction    Shoulder internal rotation 4 4  Shoulder external rotation 4 4  Mid Trap  NT  NT   Lower Trap  NT  NT  Elbow flexion    Elbow extension    Wrist flexion    Wrist extension    Wrist ulnar deviation    Wrist radial deviation    Wrist pronation    Wrist supination    (Blank rows = not tested)   LUMBAR ROM:   AROM eval  Flexion 100%  Extension 100%  Right lateral flexion 100%  Left lateral flexion 100%  Right rotation 100%  Left rotation 100%   (Blank rows = not tested)  LOWER EXTREMITY ROM:     Active  Right eval Left eval  Hip flexion    Hip extension    Hip abduction    Hip adduction    Hip internal rotation    Hip external rotation    Knee flexion    Knee extension    Ankle dorsiflexion    Ankle  plantarflexion    Ankle inversion    Ankle eversion     (Blank rows = not tested)  LOWER EXTREMITY MMT:    MMT Right eval Left eval  Hip flexion 4 4  Hip extension 4 4  Hip abduction 4- 4-  Hip adduction 4- 4-  Hip internal rotation    Hip external rotation    Knee flexion 4 4  Knee extension 4 4  Ankle dorsiflexion 4 4  Ankle plantarflexion    Ankle inversion    Ankle eversion     (Blank rows = not tested)  LUMBAR SPECIAL TESTS:  Straight leg raise test: Negative, FABER test: Negative, Thomas test: Negative, and FADIR Negative   FUNCTIONAL TESTS:  None performed   GAIT: Distance walked: 50 ft  Assistive device utilized: None Level of assistance: Complete Independence Comments: Decreased stride length and step length bilaterally  TODAY'S TREATMENT:  DATE:   12/27/22: Matrix Recumbent Bike with resistance of 5 min  HIP MMT: Hip Flex R/L 4/4+ Hip Abd R/L 4+/4 Hip Add R/L 4-/4-  Hip Ext R/l 4+/4+  Knee Flex R/L 4+/4+   Knee Ext  R/L 4+/4+  Reverse Marches with straight knee 1 x 10   12/19/22: Nu-Step seat and arms at 9 with resistance at 3 for 5 min Monster walks blue band around midfoot for 2x89m  - HR 94 (70% predicted max) OMEGA pallof press - 2x12 with 5 sec hold per side @12 #  - 9/10 on RPE Kneeling resisted trunk rotations with green theraband 1x1:00 Standing resisted trunk rotations with green theraband 1x1:00  - currently moves pelvis and spine together, which is ok - but can progress to moving them seperately  12/07/22: Nu-Step seat and arms at 9 with resistance at 3 for 7 min Blaze pod taps with red theraband around feet 3x1:00  - 2 pods on wall, 2 on floor  - min cuing to tap all pods with bottom of foot Blazepod home base sqaure with side shuffle 3x1:00  - high score 10  - HR 105 (79% predicted HR  max)    11/25/22: Nu-Step seat and arms at 9 with resistance at 3 for 5 min Resisted eversion with green band around balls of feet - 1x30 per side Peroneal nerve glides 2x10 per side Single leg balance on foam pad with blaze pods    11/24/22: Nu-Step seat and arms at 9 with resistance at 3 for 5 min  FOTO - low back - 63 with 64 expected Wall to occiput - 8 in Monster walks with blue band around ankles - 2x17m  - min cuing to lift foot on eccentric phase Monster walks with green band and bolster step overs - 4x59m  - min cuing for wider steps over bolster Mini squats with 10# and blue abduction band around knees with 10# weight 2x10  - slight pain in knees Standing marches with 5# AW's and red band around feet 3x20  - min cuing to raise knees to 90 deg flexion   11/14/22: Nu-Step seat and arms at 9 with resistance at 3 for 5 min  Ambulation with rollator 10x around OMEGA machine   Ely's Test: Positive  Prone Quad Stretch 2 x 60 sec  Hip MMT  -Flex R/L 4+/4 -Abd R/L 4/4 -Add R/L 4/4  -Ext R/L 4/4  Standing Hip Abduction with 1 UE support 2 x 10 Standing Hip Abduction with BUE support with #5 AW 1 x 10  Standing Hip Abduction with no UE support with #5 AW 1 x 10  Standing Hip Flexion #5 AW with no UE support 2 x 10    10/31/22: TM at 2.3 mph for 6 min  OMEGA Lat Pull Down Machine #20 1 x 10  OMEGA Lat Pull Down Machine #35 1 x 10  OMEGA Lat Pull Down Machine #45  1 x 10  Corner Pec Stretch 2 x 30 sec  Serratus Wall Slides with pillow case 1 x 10  Bent over horizontal abduction #1 1 x 10  Bent over horizontal abduction #2 1 x 10  Bent over horizontal abduction #3 1 x 10  Bird Dog Dogs 3 x 10     PATIENT EDUCATION:  Education details: Form and technique for correct performance of exercise  Person educated: Patient Education method: Explanation, Demonstration, Verbal cues, and Handouts Education comprehension: verbalized understanding, returned demonstration, and  verbal cues required  HOME EXERCISE PROGRAM: Access Code: [email or web address] URL: [email or web address] Date: 12/27/2022 Prepared by: Ellin Goodie  Exercises - Bird Dog  - 3-4 x weekly - 3 sets - 10 reps - Prone Quad Stretch with Strap  - 3-4 x weekly - 3 reps - 60 sec  hold - Seated Lower Limb Slump Neural Mobilization  - 1 x daily - 4 sets - 10 reps - Reverse Lunge on Slider  - 3-4 x weekly - 3 sets - 10 reps - Mini Squat with Chair  - 3-4 x weekly - 3 sets - 10 reps - Supine 90/90 Alternating Toe Touch  - 3-4 x weekly - 3 sets - 5 reps  ASSESSMENT:  CLINICAL IMPRESSION:  Pt has nearly met all of his goals with an improvement in hip strength. Despite improvement in hip strength, pt continues to experience ongoing mid back and low back pain. Modified HEP to focus on hip strength deficits and to have pt perform exercises that activate global muscle groups especially abdominals. PT to extend plan of care for five more visits to reach remaining hip strengthening goal. He will continue to benefit from skilled PT to address these aforementioned deficits to improve posture and ability to stand and walk for longer periods of time to continue to maintain independence and well being like walking dog and getting groceries.  OBJECTIVE IMPAIRMENTS: hypomobility, impaired flexibility, postural dysfunction, and pain.   ACTIVITY LIMITATIONS: carrying, lifting, standing, and squatting  PARTICIPATION LIMITATIONS: shopping, community activity, occupation, and yard work  PERSONAL FACTORS: Age, Past/current experiences, and Low back pain are also affecting patient's functional outcome.   REHAB POTENTIAL: Good  CLINICAL DECISION MAKING: Stable/uncomplicated  EVALUATION COMPLEXITY: Low   GOALS: Goals reviewed with patient? No  SHORT TERM GOALS: Target date: 10/31/2022  PT reviewed the following HEP with patient with patient able to demonstrate a set of the following with min cuing for  correction needed. PT educated patient on parameters of therex (how/when to inc/decrease intensity, frequency, rep/set range, stretch hold time, and purpose of therex) with verbalized understanding.  Baseline: NT 10/19/22 Performing independently  Goal status: ACHIEVED     LONG TERM GOALS: Target date: 12/26/2022  Patient will have improved function and activity level as evidenced by an increase in FOTO score by 10 points or more.  Baseline: 53 with target of 63 : 11/24/22 = 63 Goal status: ACHIEVED   2.  Patient will demonstrate improved periscapular strength by (1/3 grade MMT i.e 4- to 4) as evidence of improved posture and for offloading of anterior portion of thoracic vertebrae to prevent compression fractures and improve thoracic function.  Baseline: Mid Trap R/L 4/4, Rhomboid R/L 4/4, Lower Trap R/L 4-/4- , Shoulder Ext R/L 4/4  Goal status: DEFERRED   3.  Patient will demonstrate a wall to occiput test value of <2.0 cm as evidence of decreased risk of vertebral compression fracture in the setting of kyphosis.  Baseline: 8 inch (8 inch 11/24/22) Goal status: DEFERRED   4.  Patient will be able to carry out daily activities without >3/10 NRPS in thoracic spine as evidence of improve thoracic function.  Baseline: 4-5/10 NRPS - (0/10 at beginning of day, but increases to 5/10 by end) Goal status: DEFERRED   5. Pt will demonstrate improved hip and knee strength by 1/3 grade MMT (i.e. 4- to 4) to address feelings of weakness and instability when walking and performing ADLs  Baseline:  Hip flexion 4 4  Hip extension 4 4  Hip  abduction 4- 4-  Hip adduction 4- 4-  Hip internal rotation    Hip external rotation    Knee flexion 4 4  Knee extension 4 4    12/27/22 Hip Flex R/L 4/4+ Hip Abd R/L 4+/4 Hip Add R/L 4-/4-  Hip Ext R/l 4+/4+  Knee Flex R/L 4+/4+   Knee Ext  R/L 4+/4+  Goal status: ONGOING   PLAN:  PT FREQUENCY: 1-2x/week  PT DURATION: 10 weeks  PLANNED  INTERVENTIONS: Therapeutic exercises, Therapeutic activity, Neuromuscular re-education, Balance training, Gait training, Patient/Family education, Self Care, Joint mobilization, Joint manipulation, Aquatic Therapy, Dry Needling, Electrical stimulation, Spinal manipulation, Spinal mobilization, Cryotherapy, Moist heat, Traction, Manual therapy, and Re-evaluation.  PLAN FOR NEXT SESSION Continue to progress hip and abdominal strengthening exercises: planks with hip ext and dead lifts.   Ellin Goodie PT, DPT  [facility] [address] [place], [address] Phone: [phone]   Fax:  [phone]

## 2023-01-02 ENCOUNTER — Ambulatory Visit: Payer: Medicare Other | Admitting: Physical Therapy

## 2023-01-02 DIAGNOSIS — M5459 Other low back pain: Secondary | ICD-10-CM

## 2023-01-02 DIAGNOSIS — M546 Pain in thoracic spine: Secondary | ICD-10-CM

## 2023-01-02 NOTE — Therapy (Signed)
OUTPATIENT PHYSICAL THERAPY THORACOLUMBAR TREATMENT    Patient Name: Patrick Wu MRN: [id number] DOB:24-Apr-1933, 89 y.o., male Today's Date: 01/02/2023  END OF SESSION:  PT End of Session - 01/02/23 1524     Visit Number 11    Number of Visits 20    Date for PT Re-Evaluation 02/27/23    Authorization Type Medicare 2024    Authorization - Visit Number 11    Authorization - Number of Visits 20    Progress Note Due on Visit 10    PT Start Time 1520    PT Stop Time 1600    PT Time Calculation (min) 40 min    Activity Tolerance Patient tolerated treatment well    Behavior During Therapy [facility] for tasks assessed/performed                   Past Medical History:  Diagnosis Date   Asthma    Basal cell carcinoma 03/26/2019   Nodular. Glabella. Mohs 07/02/2019   Basal cell carcinoma 02/18/2021   R upper back, EDC   Basal cell carcinoma 08/26/2021   Right preauricular. Nodular and infiltrative. Mohs 12/29/2021   Past Surgical History:  Procedure Laterality Date   APPENDECTOMY     There are no active problems to display for this patient.   PCP:   REFERRING PROVIDER: Dr. Cristopher Peru  REFERRING DIAG: [phone] (ICD-10-CM) - Femoral neuropathy of lower extremity, bilateral  Rationale for Evaluation and Treatment: Rehabilitation  THERAPY DIAG:  Other low back pain  Pain in thoracic spine  ONSET DATE: June 2024   SUBJECTIVE:                                                                                                                                                                                           SUBJECTIVE STATEMENT: Pt states that he is feeling weakness in both of his legs and that it comes on when walking for a long amount of time.   PERTINENT HISTORY:  Pt reports that he had a compression fracture in his thoracic spine (T9) back in early June. He has since had resulting neuropathic pain that radiates around to his chest. He has taken gabapentin  for the pain which has helped some, but he now mostly takes it at night. He experiences most of his pain during afternoon when standing for long periods of time. If he sits for 5 minutes, then the pain decreases. He describes having a history of a right sided TIA where he lost control of his right leg. Pt was referred for neuropathy but this is unrelated to his current mid back pain.  PAIN:  Are you having pain? Yes: NPRS scale: 4-5/10 Pain location: T9  Pain description: Dull and achy  Aggravating factors: Standing for long periods of time Relieving factors: Sitting down for long periods of time.   PRECAUTIONS: None  RED FLAGS: None   WEIGHT BEARING RESTRICTIONS: No  FALLS:  Has patient fallen in last 6 months? No  LIVING ENVIRONMENT: Lives with: lives with their spouse Lives in: House/apartment Stairs: No Has following equipment at home: None  OCCUPATION: Multimedia programmer and teaches classes for [employer]   PLOF: Independent  PATIENT GOALS: Decrease mid back pain and improve posture.   NEXT MD VISIT: November 2024   OBJECTIVE:  Note: Objective measures were completed at Evaluation unless otherwise noted.  VITALS: BP 167/82 HR 58 SpO2 98%   DIAGNOSTIC FINDINGS:  CLINICAL DATA:  Thoracic region back pain with radiation around the sides.   EXAM: MRI THORACIC SPINE WITHOUT CONTRAST   TECHNIQUE: Multiplanar, multisequence MR imaging of the thoracic spine was performed. No intravenous contrast was administered.   COMPARISON:  Chest CT 08/20/2018   FINDINGS: Alignment:  Mild scoliotic curvature.   Vertebrae: Acute or subacute inferior endplate fracture at T9 with loss of height of 20%. No retropulsed bone. Old healed superior endplate fracture at T12, unchanged since a chest CT in 2020.   Benign appearing hemangioma within the T7 vertebral body.   Cord:  No cord compression or focal cord lesion.   Paraspinal and other soft tissues: Negative except for a  hiatal hernia.   Disc levels:   No significant finding from T1-2 through T5-6.   T6-7: Small central disc protrusion indents the thecal sac slightly but does not cause any neural compression.   T7-8 and T8-9: No disc abnormality or significant stenosis. 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   Inferior endplate compression fracture at T9 as noted above with marrow edema. Bilateral facet joint effusions and posterior element edema. No compressive stenosis of the central canal. Bilateral foraminal stenosis that could affect either or both T9 nerves. Findings could certainly relate to regional back pain. No finding to suggest that this is anything other than a benign fracture. The T9-10 disc bulges minimally.   At T10, there is some edema within the right pedicle, probably either deriving from the facet arthropathy or indicating a stress phenomenon. No vertebral body compression fracture.   T10-11: Mild bulging of the disc towards the left. Bilateral facet degeneration. No compressive stenosis.   T11-12: No disc abnormality. Bilateral facet degeneration and hypertrophy, worse on the right. No compressive stenosis.   T12-L1: Mild bulging of the disc.  No compressive stenosis.   IMPRESSION: 1. Acute or subacute inferior endplate fracture at T9 with loss of height of 20%. No retropulsed bone. This could be a cause of regional back pain. 2. 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 Bilateral foraminal stenosis that could affect either or both T9 nerves. Findings could also relate to regional back pain. 3. Old healed superior endplate fracture at T12, unchanged since 2020. 4. Edema within the right pedicle at T10, probably either deriving from the facet arthropathy or indicating a stress phenomenon. 5. 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     Electronically Signed   By: Paulina Fusi  M.D.   On: 08/02/2022 10:16  PATIENT SURVEYS:  FOTO 53/100 with target of 64   SCREENING FOR RED FLAGS: Bowel or bladder incontinence: No Spinal tumors: No Cauda equina  syndrome: No Compression fracture: No Abdominal aneurysm: No  COGNITION: Overall cognitive status: Within functional limits for tasks assessed     SENSATION: Numbness and tingling in bilateral feet that do not radiate up to low back   MUSCLE LENGTH: Hamstrings: Right 90 deg; Left 90 deg Thomas test: Negative bilaterally   POSTURE: rounded shoulders and forward head  PALPATION:  No tenderness to palpation   UE ROM:     Active ROM Right eval Left eval  Shoulder flexion    Shoulder extension    Shoulder abduction    Shoulder adduction    Shoulder internal rotation    Shoulder external rotation    Elbow flexion    Elbow extension    Wrist flexion    Wrist extension    Wrist ulnar deviation    Wrist radial deviation    Wrist pronation    Wrist supination    (Blank rows = not tested)        UE MMT:   Active ROM Right eval Left eval  Shoulder flexion 4+ 4+  Shoulder extension    Shoulder abduction 4+ 4+  Shoulder adduction    Shoulder internal rotation 4 4  Shoulder external rotation 4 4  Mid Trap  NT  NT   Lower Trap  NT  NT  Elbow flexion    Elbow extension    Wrist flexion    Wrist extension    Wrist ulnar deviation    Wrist radial deviation    Wrist pronation    Wrist supination    (Blank rows = not tested)   LUMBAR ROM:   AROM eval  Flexion 100%  Extension 100%  Right lateral flexion 100%  Left lateral flexion 100%  Right rotation 100%  Left rotation 100%   (Blank rows = not tested)  LOWER EXTREMITY ROM:     Active  Right eval Left eval  Hip flexion    Hip extension    Hip abduction    Hip adduction    Hip internal rotation    Hip external rotation    Knee flexion    Knee extension    Ankle dorsiflexion    Ankle plantarflexion    Ankle inversion     Ankle eversion     (Blank rows = not tested)  LOWER EXTREMITY MMT:    MMT Right eval Left eval Right 12/24/22 Left 12/24/22  Hip flexion 4 4 4  4+  Hip extension 4 4 4 4   Hip abduction 4- 4- 4+ 4  Hip adduction 4- 4- 4- 4-  Hip internal rotation      Hip external rotation      Knee flexion 4 4    Knee extension 4 4    Ankle dorsiflexion 4 4    Ankle plantarflexion      Ankle inversion      Ankle eversion       (Blank rows = not tested)  LUMBAR SPECIAL TESTS:  Straight leg raise test: Negative, FABER test: Negative, Thomas test: Negative, and FADIR Negative   FUNCTIONAL TESTS:  None performed   GAIT: Distance walked: 50 ft  Assistive device utilized: None Level of assistance: Complete Independence Comments: Decreased stride length and step length bilaterally  TODAY'S TREATMENT:  DATE:   01/02/23: Elevated TM test for 9 min at 2 mph  -Elevation from 0 incline to 15  -RPE 6/10, HR 100  or 75 % of HR Max  Heel to Ald[place] Test: Negative  Rapid Alternating Movements: Negative  Hoffman's Sign: Negative  Left Side Lying Hip Adduction with #5 lb ankle weight 1 x 10 Left Side Lying Hip Adduction with #3 lb ankle weight 2 x 10    12/27/22: Matrix Recumbent Bike with resistance of 5 min  HIP MMT: Hip Flex R/L 4/4+ Hip Abd R/L 4+/4 Hip Add R/L 4-/4-  Hip Ext R/l 4+/4+  Knee Flex R/L 4+/4+   Knee Ext  R/L 4+/4+  Reverse Marches with straight knee 1 x 10   12/19/22: Nu-Step seat and arms at 9 with resistance at 3 for 5 min Monster walks blue band around midfoot for 2x17m  - HR 94 (70% predicted max) OMEGA pallof press - 2x12 with 5 sec hold per side @12 #  - 9/10 on RPE Kneeling resisted trunk rotations with green theraband 1x1:00 Standing resisted trunk rotations with green theraband 1x1:00  - currently moves pelvis and spine together, which  is ok - but can progress to moving them seperately  PATIENT EDUCATION:  Education details: Form and technique for correct performance of exercise  Person educated: Patient Education method: Explanation, Demonstration, Verbal cues, and Handouts Education comprehension: verbalized understanding, returned demonstration, and verbal cues required  HOME EXERCISE PROGRAM: Access Code: [email or web address] URL: [email or web address] Date: 01/02/2023 Prepared by: Ellin Goodie  Exercises - Bird Dog  - 3-4 x weekly - 3 sets - 10 reps - Prone Quad Stretch with Strap  - 3-4 x weekly - 3 reps - 60 sec  hold - Seated Lower Limb Slump Neural Mobilization  - 1 x daily - 4 sets - 10 reps - Reverse Lunge on Slider  - 3-4 x weekly - 3 sets - 10 reps - Mini Squat with Chair  - 3-4 x weekly - 3 sets - 10 reps - Supine 90/90 Alternating Toe Touch  - 3-4 x weekly - 3 sets - 5 reps - Sidelying Hip Adduction  - 3-4 x weekly - 3 sets - 10 reps  ASSESSMENT:  CLINICAL IMPRESSION: Despite pt's concern about discoordination, he does not show signs or symptoms of any upper motor neuron lesions with normal RAMS and heel to shin testing and negative Hoffman's. He likely has weakness from ongoing lumbar stenosis that is exacerbated with prolonged standing. PT advised pt to take notes about when he does experience his symptoms to understand more about triggers or contextual factors. He will continue to benefit from skilled PT to address these aforementioned deficits to improve posture and ability to stand and walk for longer periods of time to continue to maintain independence and well being like walking dog and getting groceries.  OBJECTIVE IMPAIRMENTS: hypomobility, impaired flexibility, postural dysfunction, and pain.   ACTIVITY LIMITATIONS: carrying, lifting, standing, and squatting  PARTICIPATION LIMITATIONS: shopping, community activity, occupation, and yard work  PERSONAL FACTORS: Age, Past/current  experiences, and Low back pain are also affecting patient's functional outcome.   REHAB POTENTIAL: Good  CLINICAL DECISION MAKING: Stable/uncomplicated  EVALUATION COMPLEXITY: Low   GOALS: Goals reviewed with patient? No  SHORT TERM GOALS: Target date: 10/31/2022  PT reviewed the following HEP with patient with patient able to demonstrate a set of the following with min cuing for correction needed. PT educated patient on parameters of therex (how/when to inc/decrease intensity, frequency,  rep/set range, stretch hold time, and purpose of therex) with verbalized understanding.  Baseline: NT 10/19/22 Performing independently  Goal status: ACHIEVED     LONG TERM GOALS: Target date: 12/26/2022  Patient will have improved function and activity level as evidenced by an increase in FOTO score by 10 points or more.  Baseline: 53 with target of 63 : 11/24/22 = 63 Goal status: ACHIEVED   2.  Patient will demonstrate improved periscapular strength by (1/3 grade MMT i.e 4- to 4) as evidence of improved posture and for offloading of anterior portion of thoracic vertebrae to prevent compression fractures and improve thoracic function.  Baseline: Mid Trap R/L 4/4, Rhomboid R/L 4/4, Lower Trap R/L 4-/4- , Shoulder Ext R/L 4/4  Goal status: DEFERRED   3.  Patient will demonstrate a wall to occiput test value of <2.0 cm as evidence of decreased risk of vertebral compression fracture in the setting of kyphosis.  Baseline: 8 inch (8 inch 11/24/22) Goal status: DEFERRED   4.  Patient will be able to carry out daily activities without >3/10 NRPS in thoracic spine as evidence of improve thoracic function.  Baseline: 4-5/10 NRPS - (0/10 at beginning of day, but increases to 5/10 by end) Goal status: DEFERRED   5. Pt will demonstrate improved hip and knee strength by 1/3 grade MMT (i.e. 4- to 4) to address feelings of weakness and instability when walking and performing ADLs  Baseline:  Hip flexion 4 4   Hip extension 4 4  Hip abduction 4- 4-  Hip adduction 4- 4-  Hip internal rotation    Hip external rotation    Knee flexion 4 4  Knee extension 4 4    12/27/22 Hip Flex R/L 4/4+ Hip Abd R/L 4+/4 Hip Add R/L 4-/4-  Hip Ext R/l 4+/4+  Knee Flex R/L 4+/4+   Knee Ext  R/L 4+/4+  Goal status: ONGOING   PLAN:  PT FREQUENCY: 1-2x/week  PT DURATION: 10 weeks  PLANNED INTERVENTIONS: Therapeutic exercises, Therapeutic activity, Neuromuscular re-education, Balance training, Gait training, Patient/Family education, Self Care, Joint mobilization, Joint manipulation, Aquatic Therapy, Dry Needling, Electrical stimulation, Spinal manipulation, Spinal mobilization, Cryotherapy, Moist heat, Traction, Manual therapy, and Re-evaluation.  PLAN FOR NEXT SESSION Walking with rollator. Continue to progress hip and abdominal strengthening exercises: planks with hip ext and dead lifts.   Ellin Goodie PT, DPT  [facility] [address] [place], [address] Phone: [phone]   Fax:  [phone]

## 2023-01-10 ENCOUNTER — Ambulatory Visit: Payer: Medicare Other | Admitting: Physical Therapy

## 2023-01-10 DIAGNOSIS — M5459 Other low back pain: Secondary | ICD-10-CM | POA: Diagnosis not present

## 2023-01-10 DIAGNOSIS — M546 Pain in thoracic spine: Secondary | ICD-10-CM

## 2023-01-10 DIAGNOSIS — G8929 Other chronic pain: Secondary | ICD-10-CM

## 2023-01-10 NOTE — Therapy (Signed)
 OUTPATIENT PHYSICAL THERAPY THORACOLUMBAR TREATMENT    Patient Name: Patrick Wu MRN: [id number] DOB:18-Aug-1933, 89 y.o., male Today's Date: 01/10/2023  END OF SESSION:  PT End of Session - 01/10/23 1615     Visit Number 12    Number of Visits 20    Date for PT Re-Evaluation 02/27/23    Authorization Type Medicare 2024    Authorization - Visit Number 12    Authorization - Number of Visits 20    Progress Note Due on Visit 20    PT Start Time 1610    PT Stop Time 1650    PT Time Calculation (min) 40 min    Activity Tolerance Patient tolerated treatment well    Behavior During Therapy [facility] for tasks assessed/performed                   Past Medical History:  Diagnosis Date   Asthma    Basal cell carcinoma 03/26/2019   Nodular. Glabella. Mohs 07/02/2019   Basal cell carcinoma 02/18/2021   R upper back, EDC   Basal cell carcinoma 08/26/2021   Right preauricular. Nodular and infiltrative. Mohs 12/29/2021   Past Surgical History:  Procedure Laterality Date   APPENDECTOMY     There are no active problems to display for this patient.   PCP:   REFERRING PROVIDER: Dr. Jannett Fairly  REFERRING DIAG: [phone] (ICD-10-CM) - Femoral neuropathy of lower extremity, bilateral  Rationale for Evaluation and Treatment: Rehabilitation  THERAPY DIAG:  Other low back pain  Pain in thoracic spine  Chronic pain of right knee  ONSET DATE: June 2024   SUBJECTIVE:                                                                                                                                                                                           SUBJECTIVE STATEMENT: Pt reports that he is feeling better today even with increased activity. He really only feels pain in right knee today.   PERTINENT HISTORY:  Pt reports that he had a compression fracture in his thoracic spine (T9) back in early June. He has since had resulting neuropathic pain that radiates around to his  chest. He has taken gabapentin for the pain which has helped some, but he now mostly takes it at night. He experiences most of his pain during afternoon when standing for long periods of time. If he sits for 5 minutes, then the pain decreases. He describes having a history of a right sided TIA where he lost control of his right leg. Pt was referred for neuropathy but this is unrelated to his current mid back  pain.   PAIN:  Are you having pain? Yes: NPRS scale: 1-2/10 Pain location: T9  Pain description: Dull and achy  Aggravating factors: Standing for long periods of time Relieving factors: Sitting down for long periods of time.   PRECAUTIONS: None  RED FLAGS: None   WEIGHT BEARING RESTRICTIONS: No  FALLS:  Has patient fallen in last 6 months? No  LIVING ENVIRONMENT: Lives with: lives with their spouse Lives in: House/apartment Stairs: No Has following equipment at home: None  OCCUPATION: Multimedia Programmer and teaches classes for [employer]   PLOF: Independent  PATIENT GOALS: Decrease mid back pain and improve posture.   NEXT MD VISIT: November 2024   OBJECTIVE:  Note: Objective measures were completed at Evaluation unless otherwise noted.  VITALS: BP 167/82 HR 58 SpO2 98%   DIAGNOSTIC FINDINGS:  CLINICAL DATA:  Thoracic region back pain with radiation around the sides.   EXAM: MRI THORACIC SPINE WITHOUT CONTRAST   TECHNIQUE: Multiplanar, multisequence MR imaging of the thoracic spine was performed. No intravenous contrast was administered.   COMPARISON:  Chest CT 08/20/2018   FINDINGS: Alignment:  Mild scoliotic curvature.   Vertebrae: Acute or subacute inferior endplate fracture at T9 with loss of height of 20%. No retropulsed bone. Old healed superior endplate fracture at T12, unchanged since a chest CT in 2020.   Benign appearing hemangioma within the T7 vertebral body.   Cord:  No cord compression or focal cord lesion.   Paraspinal and other soft tissues:  Negative except for a hiatal hernia.   Disc levels:   No significant finding from T1-2 through T5-6.   T6-7: Small central disc protrusion indents the thecal sac slightly but does not cause any neural compression.   T7-8 and T8-9: No disc abnormality or significant stenosis. 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   Inferior endplate compression fracture at T9 as noted above with marrow edema. Bilateral facet joint effusions and posterior element edema. No compressive stenosis of the central canal. Bilateral foraminal stenosis that could affect either or both T9 nerves. Findings could certainly relate to regional back pain. No finding to suggest that this is anything other than a benign fracture. The T9-10 disc bulges minimally.   At T10, there is some edema within the right pedicle, probably either deriving from the facet arthropathy or indicating a stress phenomenon. No vertebral body compression fracture.   T10-11: Mild bulging of the disc towards the left. Bilateral facet degeneration. No compressive stenosis.   T11-12: No disc abnormality. Bilateral facet degeneration and hypertrophy, worse on the right. No compressive stenosis.   T12-L1: Mild bulging of the disc.  No compressive stenosis.   IMPRESSION: 1. Acute or subacute inferior endplate fracture at T9 with loss of height of 20%. No retropulsed bone. This could be a cause of regional back pain. 2. 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 Bilateral foraminal stenosis that could affect either or both T9 nerves. Findings could also relate to regional back pain. 3. Old healed superior endplate fracture at T12, unchanged since 2020. 4. Edema within the right pedicle at T10, probably either deriving from the facet arthropathy or indicating a stress phenomenon. 5. 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     Electronically Signed    By: Oneil Officer M.D.   On: 08/02/2022 10:16  PATIENT SURVEYS:  FOTO 53/100 with target of 64   SCREENING FOR RED FLAGS: Bowel or bladder incontinence: No Spinal tumors:  No Cauda equina syndrome: No Compression fracture: No Abdominal aneurysm: No  COGNITION: Overall cognitive status: Within functional limits for tasks assessed     SENSATION: Numbness and tingling in bilateral feet that do not radiate up to low back   MUSCLE LENGTH: Hamstrings: Right 90 deg; Left 90 deg Thomas test: Negative bilaterally   POSTURE: rounded shoulders and forward head  PALPATION:  No tenderness to palpation   UE ROM:     Active ROM Right eval Left eval  Shoulder flexion    Shoulder extension    Shoulder abduction    Shoulder adduction    Shoulder internal rotation    Shoulder external rotation    Elbow flexion    Elbow extension    Wrist flexion    Wrist extension    Wrist ulnar deviation    Wrist radial deviation    Wrist pronation    Wrist supination    (Blank rows = not tested)        UE MMT:   Active ROM Right eval Left eval  Shoulder flexion 4+ 4+  Shoulder extension    Shoulder abduction 4+ 4+  Shoulder adduction    Shoulder internal rotation 4 4  Shoulder external rotation 4 4  Mid Trap  NT  NT   Lower Trap  NT  NT  Elbow flexion    Elbow extension    Wrist flexion    Wrist extension    Wrist ulnar deviation    Wrist radial deviation    Wrist pronation    Wrist supination    (Blank rows = not tested)   LUMBAR ROM:   AROM eval  Flexion 100%  Extension 100%  Right lateral flexion 100%  Left lateral flexion 100%  Right rotation 100%  Left rotation 100%   (Blank rows = not tested)  LOWER EXTREMITY ROM:     Active  Right eval Left eval  Hip flexion    Hip extension    Hip abduction    Hip adduction    Hip internal rotation    Hip external rotation    Knee flexion    Knee extension    Ankle dorsiflexion    Ankle plantarflexion     Ankle inversion    Ankle eversion     (Blank rows = not tested)  LOWER EXTREMITY MMT:    MMT Right eval Left eval Right 12/24/22 Left 12/24/22  Hip flexion 4 4 4  4+  Hip extension 4 4 4 4   Hip abduction 4- 4- 4+ 4  Hip adduction 4- 4- 4- 4-  Hip internal rotation      Hip external rotation      Knee flexion 4 4    Knee extension 4 4    Ankle dorsiflexion 4 4    Ankle plantarflexion      Ankle inversion      Ankle eversion       (Blank rows = not tested)  LUMBAR SPECIAL TESTS:  Straight leg raise test: Negative, FABER test: Negative, Thomas test: Negative, and FADIR Negative   FUNCTIONAL TESTS:  None performed   GAIT: Distance walked: 50 ft  Assistive device utilized: None Level of assistance: Complete Independence Comments: Decreased stride length and step length bilaterally  TODAY'S TREATMENT:  DATE:   01/10/23: Nu-Step with seat at 10 and resistance at 2 for 5 min  Bird Dog with #3 lb AW on BLE 3 x 10  -RPE 5/10  Bird Dog with #3 lb AW on BLE and #3 DB 1 x 10  Mini-Squat with #5 lb DB X 2 - 3 x 10  Reverse Lunges with BUE support 1 x 10  -Pt reports experiencing crepitus  -min VC to decrease step length on right  Reverse Lunges with 1 UE support 1 x 10  Reverse Lunges with no UE support 1 x 10  Supine 90/90 Alternating heel taps with extended knee 1 X 10 -Pt reports increased low back pain  Supine 90/90 alternating heel taps with flexed knee 1 x 10   01/02/23: Elevated TM test for 9 min at 2 mph  -Elevation from 0 incline to 15  -RPE 6/10, HR 100  or 75 % of HR Max  Heel to [place] Test: Negative  Rapid Alternating Movements: Negative  Hoffman's Sign: Negative  Left Side Lying Hip Adduction with #5 lb ankle weight 1 x 10 Left Side Lying Hip Adduction with #3 lb ankle weight 2 x 10    12/27/22: Matrix Recumbent Bike with  resistance of 5 min  HIP MMT: Hip Flex R/L 4/4+ Hip Abd R/L 4+/4 Hip Add R/L 4-/4-  Hip Ext R/l 4+/4+  Knee Flex R/L 4+/4+   Knee Ext  R/L 4+/4+  Reverse Marches with straight knee 1 x 10   12/19/22: Nu-Step seat and arms at 9 with resistance at 3 for 5 min Monster walks blue band around midfoot for 2x1m  - HR 94 (70% predicted max) OMEGA pallof press - 2x12 with 5 sec hold per side @12 #  - 9/10 on RPE Kneeling resisted trunk rotations with green theraband 1x1:00 Standing resisted trunk rotations with green theraband 1x1:00  - currently moves pelvis and spine together, which is ok - but can progress to moving them seperately  PATIENT EDUCATION:  Education details: Form and technique for correct performance of exercise  Person educated: Patient Education method: Explanation, Demonstration, Verbal cues, and Handouts Education comprehension: verbalized understanding, returned demonstration, and verbal cues required  HOME EXERCISE PROGRAM: Access Code: [email or web address] URL: [email or web address] Date: 01/02/2023 Prepared by: Toribio Servant  Exercises - Bird Dog  - 3-4 x weekly - 3 sets - 10 reps - Prone Quad Stretch with Strap  - 3-4 x weekly - 3 reps - 60 sec  hold - Seated Lower Limb Slump Neural Mobilization  - 1 x daily - 4 sets - 10 reps - Reverse Lunge on Slider  - 3-4 x weekly - 3 sets - 10 reps - Mini Squat with Chair  - 3-4 x weekly - 3 sets - 10 reps - Supine 90/90 Alternating Toe Touch  - 3-4 x weekly - 3 sets - 5 reps - Sidelying Hip Adduction  - 3-4 x weekly - 3 sets - 10 reps  ASSESSMENT:  CLINICAL IMPRESSION: Pt shows ongoing improvement with LE strength and activity tolerance with ability to perform exercises with increased resistance. He only felt an increase in his back pain when performing abdominal exercises with knee extension. Modified exercise to flexed knee to decrease lever arm and pt was able to perform marches without an increase in his low  back pain. He will continue to benefit from skilled PT to address these aforementioned deficits to improve posture and ability to stand and walk for longer periods of time  to continue to maintain independence and well being like walking dog and getting groceries.   OBJECTIVE IMPAIRMENTS: hypomobility, impaired flexibility, postural dysfunction, and pain.   ACTIVITY LIMITATIONS: carrying, lifting, standing, and squatting  PARTICIPATION LIMITATIONS: shopping, community activity, occupation, and yard work  PERSONAL FACTORS: Age, Past/current experiences, and Low back pain are also affecting patient's functional outcome.   REHAB POTENTIAL: Good  CLINICAL DECISION MAKING: Stable/uncomplicated  EVALUATION COMPLEXITY: Low   GOALS: Goals reviewed with patient? No  SHORT TERM GOALS: Target date: 10/31/2022  PT reviewed the following HEP with patient with patient able to demonstrate a set of the following with min cuing for correction needed. PT educated patient on parameters of therex (how/when to inc/decrease intensity, frequency, rep/set range, stretch hold time, and purpose of therex) with verbalized understanding.  Baseline: NT 10/19/22 Performing independently  Goal status: ACHIEVED     LONG TERM GOALS: Target date: 12/26/2022  Patient will have improved function and activity level as evidenced by an increase in FOTO score by 10 points or more.  Baseline: 53 with target of 63 : 11/24/22 = 63 Goal status: ACHIEVED   2.  Patient will demonstrate improved periscapular strength by (1/3 grade MMT i.e 4- to 4) as evidence of improved posture and for offloading of anterior portion of thoracic vertebrae to prevent compression fractures and improve thoracic function.  Baseline: Mid Trap R/L 4/4, Rhomboid R/L 4/4, Lower Trap R/L 4-/4- , Shoulder Ext R/L 4/4  Goal status: DEFERRED   3.  Patient will demonstrate a wall to occiput test value of <2.0 cm as evidence of decreased risk of vertebral  compression fracture in the setting of kyphosis.  Baseline: 8 inch (8 inch 11/24/22) Goal status: DEFERRED   4.  Patient will be able to carry out daily activities without >3/10 NRPS in thoracic spine as evidence of improve thoracic function.  Baseline: 4-5/10 NRPS - (0/10 at beginning of day, but increases to 5/10 by end) Goal status: DEFERRED   5. Pt will demonstrate improved hip and knee strength by 1/3 grade MMT (i.e. 4- to 4) to address feelings of weakness and instability when walking and performing ADLs  Baseline:  Hip flexion 4 4  Hip extension 4 4  Hip abduction 4- 4-  Hip adduction 4- 4-  Hip internal rotation    Hip external rotation    Knee flexion 4 4  Knee extension 4 4    12/27/22 Hip Flex R/L 4/4+ Hip Abd R/L 4+/4 Hip Add R/L 4-/4-  Hip Ext R/l 4+/4+  Knee Flex R/L 4+/4+   Knee Ext  R/L 4+/4+  Goal status: ONGOING   PLAN:  PT FREQUENCY: 1-2x/week  PT DURATION: 10 weeks  PLANNED INTERVENTIONS: Therapeutic exercises, Therapeutic activity, Neuromuscular re-education, Balance training, Gait training, Patient/Family education, Self Care, Joint mobilization, Joint manipulation, Aquatic Therapy, Dry Needling, Electrical stimulation, Spinal manipulation, Spinal mobilization, Cryotherapy, Moist heat, Traction, Manual therapy, and Re-evaluation.  PLAN FOR NEXT SESSION Continue to progress hip and abdominal strengthening exercises: planks with hip ext and dead lifts. Progress hip strengthening exercises with increased resistance with hip adduction.   Toribio Servant PT, DPT  [facility] [address], [place], [address] Phone: [phone]   Fax:  [phone]

## 2023-01-18 ENCOUNTER — Ambulatory Visit: Payer: Medicare Other | Attending: Neurology | Admitting: Physical Therapy

## 2023-01-18 DIAGNOSIS — M25561 Pain in right knee: Secondary | ICD-10-CM | POA: Diagnosis present

## 2023-01-18 DIAGNOSIS — M5459 Other low back pain: Secondary | ICD-10-CM

## 2023-01-18 DIAGNOSIS — G8929 Other chronic pain: Secondary | ICD-10-CM

## 2023-01-18 DIAGNOSIS — M546 Pain in thoracic spine: Secondary | ICD-10-CM | POA: Diagnosis present

## 2023-01-18 NOTE — Therapy (Signed)
 OUTPATIENT PHYSICAL THERAPY THORACOLUMBAR TREATMENT    Patient Name: Patrick Wu MRN: [id number] DOB:02/10/1933, 89 y.o., male Today's Date: 01/18/2023  END OF SESSION:  PT End of Session - 01/18/23 1526     Visit Number 13    Number of Visits 20    Date for PT Re-Evaluation 02/27/23    Authorization Type Medicare 2024    Authorization - Visit Number 13    Authorization - Number of Visits 20    Progress Note Due on Visit 20    PT Start Time 1520    PT Stop Time 1500    PT Time Calculation (min) 1420 min    Activity Tolerance Patient tolerated treatment well    Behavior During Therapy [facility] for tasks assessed/performed                    Past Medical History:  Diagnosis Date   Asthma    Basal cell carcinoma 03/26/2019   Nodular. Glabella. Mohs 07/02/2019   Basal cell carcinoma 02/18/2021   R upper back, EDC   Basal cell carcinoma 08/26/2021   Right preauricular. Nodular and infiltrative. Mohs 12/29/2021   Past Surgical History:  Procedure Laterality Date   APPENDECTOMY     There are no active problems to display for this patient.   PCP:   REFERRING PROVIDER: Dr. Jannett Fairly  REFERRING DIAG: [phone] (ICD-10-CM) - Femoral neuropathy of lower extremity, bilateral  Rationale for Evaluation and Treatment: Rehabilitation  THERAPY DIAG:  Other low back pain  Pain in thoracic spine  Chronic pain of right knee  ONSET DATE: June 2024   SUBJECTIVE:                                                                                                                                                                                           SUBJECTIVE STATEMENT: Pt states that he is following up with a neuroradiologist to give further insight about neuropathy. Pt reports that his neuropathy is usually worse along with his RLE neuropathy.   PERTINENT HISTORY:  Pt reports that he had a compression fracture in his thoracic spine (T9) back in early June. He has  since had resulting neuropathic pain that radiates around to his chest. He has taken gabapentin for the pain which has helped some, but he now mostly takes it at night. He experiences most of his pain during afternoon when standing for long periods of time. If he sits for 5 minutes, then the pain decreases. He describes having a history of a right sided TIA where he lost control of his right leg. Pt was referred for neuropathy  but this is unrelated to his current mid back pain.   PAIN:  Are you having pain? Yes: NPRS scale: 1-2/10 Pain location: T9  Pain description: Dull and achy  Aggravating factors: Standing for long periods of time Relieving factors: Sitting down for long periods of time.   PRECAUTIONS: None  RED FLAGS: None   WEIGHT BEARING RESTRICTIONS: No  FALLS:  Has patient fallen in last 6 months? No  LIVING ENVIRONMENT: Lives with: lives with their spouse Lives in: House/apartment Stairs: No Has following equipment at home: None  OCCUPATION: Multimedia Programmer and teaches classes for [employer]   PLOF: Independent  PATIENT GOALS: Decrease mid back pain and improve posture.   NEXT MD VISIT: November 2024   OBJECTIVE:  Note: Objective measures were completed at Evaluation unless otherwise noted.  VITALS: BP 167/82 HR 58 SpO2 98%   DIAGNOSTIC FINDINGS:  CLINICAL DATA:  Thoracic region back pain with radiation around the sides.   EXAM: MRI THORACIC SPINE WITHOUT CONTRAST   TECHNIQUE: Multiplanar, multisequence MR imaging of the thoracic spine was performed. No intravenous contrast was administered.   COMPARISON:  Chest CT 08/20/2018   FINDINGS: Alignment:  Mild scoliotic curvature.   Vertebrae: Acute or subacute inferior endplate fracture at T9 with loss of height of 20%. No retropulsed bone. Old healed superior endplate fracture at T12, unchanged since a chest CT in 2020.   Benign appearing hemangioma within the T7 vertebral body.   Cord:  No cord  compression or focal cord lesion.   Paraspinal and other soft tissues: Negative except for a hiatal hernia.   Disc levels:   No significant finding from T1-2 through T5-6.   T6-7: Small central disc protrusion indents the thecal sac slightly but does not cause any neural compression.   T7-8 and T8-9: No disc abnormality or significant stenosis. 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   Inferior endplate compression fracture at T9 as noted above with marrow edema. Bilateral facet joint effusions and posterior element edema. No compressive stenosis of the central canal. Bilateral foraminal stenosis that could affect either or both T9 nerves. Findings could certainly relate to regional back pain. No finding to suggest that this is anything other than a benign fracture. The T9-10 disc bulges minimally.   At T10, there is some edema within the right pedicle, probably either deriving from the facet arthropathy or indicating a stress phenomenon. No vertebral body compression fracture.   T10-11: Mild bulging of the disc towards the left. Bilateral facet degeneration. No compressive stenosis.   T11-12: No disc abnormality. Bilateral facet degeneration and hypertrophy, worse on the right. No compressive stenosis.   T12-L1: Mild bulging of the disc.  No compressive stenosis.   IMPRESSION: 1. Acute or subacute inferior endplate fracture at T9 with loss of height of 20%. No retropulsed bone. This could be a cause of regional back pain. 2. 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 Bilateral foraminal stenosis that could affect either or both T9 nerves. Findings could also relate to regional back pain. 3. Old healed superior endplate fracture at T12, unchanged since 2020. 4. Edema within the right pedicle at T10, probably either deriving from the facet arthropathy or indicating a stress phenomenon. 5. Facet osteoarthritis at T10-11  and T11-12 without compressive stenosis.     Electronically Signed   By: Oneil Officer M.D.   On: 08/02/2022 10:16  PATIENT SURVEYS:  FOTO 53/100 with target of 64   SCREENING FOR  RED FLAGS: Bowel or bladder incontinence: No Spinal tumors: No Cauda equina syndrome: No Compression fracture: No Abdominal aneurysm: No  COGNITION: Overall cognitive status: Within functional limits for tasks assessed     SENSATION: Numbness and tingling in bilateral feet that do not radiate up to low back   MUSCLE LENGTH: Hamstrings: Right 90 deg; Left 90 deg Thomas test: Negative bilaterally   POSTURE: rounded shoulders and forward head  PALPATION:  No tenderness to palpation   UE ROM:     Active ROM Right eval Left eval  Shoulder flexion    Shoulder extension    Shoulder abduction    Shoulder adduction    Shoulder internal rotation    Shoulder external rotation    Elbow flexion    Elbow extension    Wrist flexion    Wrist extension    Wrist ulnar deviation    Wrist radial deviation    Wrist pronation    Wrist supination    (Blank rows = not tested)        UE MMT:   Active ROM Right eval Left eval  Shoulder flexion 4+ 4+  Shoulder extension    Shoulder abduction 4+ 4+  Shoulder adduction    Shoulder internal rotation 4 4  Shoulder external rotation 4 4  Mid Trap  NT  NT   Lower Trap  NT  NT  Elbow flexion    Elbow extension    Wrist flexion    Wrist extension    Wrist ulnar deviation    Wrist radial deviation    Wrist pronation    Wrist supination    (Blank rows = not tested)   LUMBAR ROM:   AROM eval  Flexion 100%  Extension 100%  Right lateral flexion 100%  Left lateral flexion 100%  Right rotation 100%  Left rotation 100%   (Blank rows = not tested)  LOWER EXTREMITY ROM:     Active  Right eval Left eval  Hip flexion    Hip extension    Hip abduction    Hip adduction    Hip internal rotation    Hip external rotation    Knee flexion     Knee extension    Ankle dorsiflexion    Ankle plantarflexion    Ankle inversion    Ankle eversion     (Blank rows = not tested)  LOWER EXTREMITY MMT:    MMT Right eval Left eval Right 12/24/22 Left 12/24/22  Hip flexion 4 4 4  4+  Hip extension 4 4 4 4   Hip abduction 4- 4- 4+ 4  Hip adduction 4- 4- 4- 4-  Hip internal rotation      Hip external rotation      Knee flexion 4 4    Knee extension 4 4    Ankle dorsiflexion 4 4    Ankle plantarflexion      Ankle inversion      Ankle eversion       (Blank rows = not tested)  LUMBAR SPECIAL TESTS:  Straight leg raise test: Negative, FABER test: Negative, Thomas test: Negative, and FADIR Negative   FUNCTIONAL TESTS:  None performed   GAIT: Distance walked: 50 ft  Assistive device utilized: None Level of assistance: Complete Independence Comments: Decreased stride length and step length bilaterally  TODAY'S TREATMENT:  DATE:   01/18/23: Nu-Step with seat at 10 and resistance at 2 for 5 min Squat 3 x 10 with shoulder flexion to 2 x #5 DB  -min VC to bring weight back onto heels Reverse Lunges with BUE support with RLE  -min VC to maintain up Reverse Lunges with 1 UE  support for LLE 1 x 10 Reverse Lunges with BUE support with RLE #5 Ankle Weight  1 x 10  Reverse Lunges with 1 UE support with 1 UE support RLE #5 Ankle Weight 1 x 10  Reverse Lunges with BUE support with RLE #5 Ankle Weight x 2-  1 x 10  Reverse Lunges with 1 UE support with 1 UE support RLE #5 Ankle Weight  x 2 1 x 10  Reverse Lunges with BUE support with RLE #5 Ankle Weight x 2 with RLE step up onto 6 inch step-  1 x 10   01/10/23: Nu-Step with seat at 10 and resistance at 2 for 5 min  Bird Dog with #3 lb AW on BLE 3 x 10  -RPE 5/10  Bird Dog with #3 lb AW on BLE and #3 DB 1 x 10  Mini-Squat with #5 lb DB X 2 - 3 x 10  Reverse  Lunges with BUE support 1 x 10  -Pt reports experiencing crepitus  -min VC to decrease step length on right  Reverse Lunges with 1 UE support 1 x 10  Reverse Lunges with no UE support 1 x 10  Supine 90/90 Alternating heel taps with extended knee 1 X 10 -Pt reports increased low back pain  Supine 90/90 alternating heel taps with flexed knee 1 x 10   01/02/23: Elevated TM test for 9 min at 2 mph  -Elevation from 0 incline to 15  -RPE 6/10, HR 100  or 75 % of HR Max  Heel to [place] Test: Negative  Rapid Alternating Movements: Negative  Hoffman's Sign: Negative  Left Side Lying Hip Adduction with #5 lb ankle weight 1 x 10 Left Side Lying Hip Adduction with #3 lb ankle weight 2 x 10    12/27/22: Matrix Recumbent Bike with resistance of 5 min  HIP MMT: Hip Flex R/L 4/4+ Hip Abd R/L 4+/4 Hip Add R/L 4-/4-  Hip Ext R/l 4+/4+  Knee Flex R/L 4+/4+   Knee Ext  R/L 4+/4+  Reverse Marches with straight knee 1 x 10    PATIENT EDUCATION:  Education details: Form and technique for correct performance of exercise  Person educated: Patient Education method: Programmer, Multimedia, Demonstration, Verbal cues, and Handouts Education comprehension: verbalized understanding, returned demonstration, and verbal cues required  HOME EXERCISE PROGRAM: Access Code: [email or web address] URL: [email or web address] Date: 01/18/2023 Prepared by: Toribio Servant  Exercises - Prone Quad Stretch with Strap  - 3-4 x weekly - 3 reps - 60 sec  hold - Seated Lower Limb Slump Neural Mobilization  - 1 x daily - 4 sets - 10 reps - Bird Dog  - 3-4 x weekly - 3 sets - 10 reps - Reverse Lunge on Slider  - 3-4 x weekly - 3 sets - 10 reps - Mini Squat with Chair  - 3-4 x weekly - 3 sets - 10 reps - Supine 90/90 Alternating Toe Touch  - 3-4 x weekly - 3 sets - 5 reps - Sidelying Hip Adduction  - 3-4 x weekly - 3 sets - 10 reps  ASSESSMENT:  CLINICAL IMPRESSION: Despite report of increased neuropathy, pt was able to  perform  all hip strengthening exercises without difficulty. He did need mod VC and TC to perform reverse lunges with him tending to adduct LLE with stepping backwards. He was able to perform exercise correctly with additional UE support. He will continue to benefit from skilled PT to address these aforementioned deficits to improve posture and ability to stand and walk for longer periods of time to continue to maintain independence and well being like walking dog and getting groceries.   OBJECTIVE IMPAIRMENTS: hypomobility, impaired flexibility, postural dysfunction, and pain.   ACTIVITY LIMITATIONS: carrying, lifting, standing, and squatting  PARTICIPATION LIMITATIONS: shopping, community activity, occupation, and yard work  PERSONAL FACTORS: Age, Past/current experiences, and Low back pain are also affecting patient's functional outcome.   REHAB POTENTIAL: Good  CLINICAL DECISION MAKING: Stable/uncomplicated  EVALUATION COMPLEXITY: Low   GOALS: Goals reviewed with patient? No  SHORT TERM GOALS: Target date: 10/31/2022  PT reviewed the following HEP with patient with patient able to demonstrate a set of the following with min cuing for correction needed. PT educated patient on parameters of therex (how/when to inc/decrease intensity, frequency, rep/set range, stretch hold time, and purpose of therex) with verbalized understanding.  Baseline: NT 10/19/22 Performing independently  Goal status: ACHIEVED     LONG TERM GOALS: Target date: 12/26/2022  Patient will have improved function and activity level as evidenced by an increase in FOTO score by 10 points or more.  Baseline: 53 with target of 63 : 11/24/22 = 63 Goal status: ACHIEVED   2.  Patient will demonstrate improved periscapular strength by (1/3 grade MMT i.e 4- to 4) as evidence of improved posture and for offloading of anterior portion of thoracic vertebrae to prevent compression fractures and improve thoracic function.   Baseline: Mid Trap R/L 4/4, Rhomboid R/L 4/4, Lower Trap R/L 4-/4- , Shoulder Ext R/L 4/4  Goal status: DEFERRED   3.  Patient will demonstrate a wall to occiput test value of <2.0 cm as evidence of decreased risk of vertebral compression fracture in the setting of kyphosis.  Baseline: 8 inch (8 inch 11/24/22) Goal status: DEFERRED   4.  Patient will be able to carry out daily activities without >3/10 NRPS in thoracic spine as evidence of improve thoracic function.  Baseline: 4-5/10 NRPS - (0/10 at beginning of day, but increases to 5/10 by end) Goal status: DEFERRED   5. Pt will demonstrate improved hip and knee strength by 1/3 grade MMT (i.e. 4- to 4) to address feelings of weakness and instability when walking and performing ADLs  Baseline:  Hip flexion 4 4  Hip extension 4 4  Hip abduction 4- 4-  Hip adduction 4- 4-  Hip internal rotation    Hip external rotation    Knee flexion 4 4  Knee extension 4 4    12/27/22 Hip Flex R/L 4/4+ Hip Abd R/L 4+/4 Hip Add R/L 4-/4-  Hip Ext R/l 4+/4+  Knee Flex R/L 4+/4+   Knee Ext  R/L 4+/4+  Goal status: ONGOING   PLAN:  PT FREQUENCY: 1-2x/week  PT DURATION: 10 weeks  PLANNED INTERVENTIONS: Therapeutic exercises, Therapeutic activity, Neuromuscular re-education, Balance training, Gait training, Patient/Family education, Self Care, Joint mobilization, Joint manipulation, Aquatic Therapy, Dry Needling, Electrical stimulation, Spinal manipulation, Spinal mobilization, Cryotherapy, Moist heat, Traction, Manual therapy, and Re-evaluation.  PLAN FOR NEXT SESSION Continue to progress hip and abdominal strengthening exercises: planks with hip ext and dead lifts. Progress hip strengthening exercises with increased resistance with hip adduction and/or other strengthening  exercises to strengthen hip adduction.   Toribio Servant PT, DPT  [facility] [address] [place], [address] Phone:  [phone]   Fax:  [phone]

## 2023-01-24 ENCOUNTER — Ambulatory Visit: Payer: Medicare Other

## 2023-01-24 ENCOUNTER — Encounter: Payer: Self-pay | Admitting: Physical Therapy

## 2023-01-24 DIAGNOSIS — M5459 Other low back pain: Secondary | ICD-10-CM

## 2023-01-24 DIAGNOSIS — G8929 Other chronic pain: Secondary | ICD-10-CM

## 2023-01-24 DIAGNOSIS — M546 Pain in thoracic spine: Secondary | ICD-10-CM

## 2023-01-24 NOTE — Therapy (Signed)
 OUTPATIENT PHYSICAL THERAPY THORACOLUMBAR TREATMENT    Patient Name: Patrick Wu MRN: [id number] DOB:04-08-1933, 89 y.o., male Today's Date: 01/24/2023  END OF SESSION:  PT End of Session - 01/24/23 1515     Visit Number 14    Number of Visits 20    Date for PT Re-Evaluation 02/27/23    Authorization Type Medicare 2024    Authorization - Number of Visits 20    Progress Note Due on Visit 20    PT Start Time 1515    PT Stop Time 1558    PT Time Calculation (min) 43 min    Activity Tolerance Patient tolerated treatment well    Behavior During Therapy [facility] for tasks assessed/performed                     Past Medical History:  Diagnosis Date   Asthma    Basal cell carcinoma 03/26/2019   Nodular. Glabella. Mohs 07/02/2019   Basal cell carcinoma 02/18/2021   R upper back, EDC   Basal cell carcinoma 08/26/2021   Right preauricular. Nodular and infiltrative. Mohs 12/29/2021   Past Surgical History:  Procedure Laterality Date   APPENDECTOMY     There are no active problems to display for this patient.   PCP:   REFERRING PROVIDER: Dr. Jannett Fairly  REFERRING DIAG: [phone] (ICD-10-CM) - Femoral neuropathy of lower extremity, bilateral  Rationale for Evaluation and Treatment: Rehabilitation  THERAPY DIAG:  Other low back pain  Pain in thoracic spine  Chronic pain of right knee  ONSET DATE: June 2024   SUBJECTIVE:                                                                                                                                                                                           SUBJECTIVE STATEMENT:   Patient reports feeling tired this PM due to moving furniture with his wife and going for a walk with his dog   PERTINENT HISTORY:  Pt reports that he had a compression fracture in his thoracic spine (T9) back in early June. He has since had resulting neuropathic pain that radiates around to his chest. He has taken gabapentin for the  pain which has helped some, but he now mostly takes it at night. He experiences most of his pain during afternoon when standing for long periods of time. If he sits for 5 minutes, then the pain decreases. He describes having a history of a right sided TIA where he lost control of his right leg. Pt was referred for neuropathy but this is unrelated to his current mid back pain.   PAIN:  Are you having pain? Yes: NPRS scale: 1-2/10 Pain location: T9  Pain description: Dull and achy  Aggravating factors: Standing for long periods of time Relieving factors: Sitting down for long periods of time.   PRECAUTIONS: None  RED FLAGS: None   WEIGHT BEARING RESTRICTIONS: No  FALLS:  Has patient fallen in last 6 months? No  LIVING ENVIRONMENT: Lives with: lives with their spouse Lives in: House/apartment Stairs: No Has following equipment at home: None  OCCUPATION: Multimedia Programmer and teaches classes for [employer]   PLOF: Independent  PATIENT GOALS: Decrease mid back pain and improve posture.   NEXT MD VISIT: November 2024   OBJECTIVE:  Note: Objective measures were completed at Evaluation unless otherwise noted.  VITALS: BP 167/82 HR 58 SpO2 98%   DIAGNOSTIC FINDINGS:  CLINICAL DATA:  Thoracic region back pain with radiation around the sides.   EXAM: MRI THORACIC SPINE WITHOUT CONTRAST   TECHNIQUE: Multiplanar, multisequence MR imaging of the thoracic spine was performed. No intravenous contrast was administered.   COMPARISON:  Chest CT 08/20/2018   FINDINGS: Alignment:  Mild scoliotic curvature.   Vertebrae: Acute or subacute inferior endplate fracture at T9 with loss of height of 20%. No retropulsed bone. Old healed superior endplate fracture at T12, unchanged since a chest CT in 2020.   Benign appearing hemangioma within the T7 vertebral body.   Cord:  No cord compression or focal cord lesion.   Paraspinal and other soft tissues: Negative except for a hiatal hernia.    Disc levels:   No significant finding from T1-2 through T5-6.   T6-7: Small central disc protrusion indents the thecal sac slightly but does not cause any neural compression.   T7-8 and T8-9: No disc abnormality or significant stenosis. 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   Inferior endplate compression fracture at T9 as noted above with marrow edema. Bilateral facet joint effusions and posterior element edema. No compressive stenosis of the central canal. Bilateral foraminal stenosis that could affect either or both T9 nerves. Findings could certainly relate to regional back pain. No finding to suggest that this is anything other than a benign fracture. The T9-10 disc bulges minimally.   At T10, there is some edema within the right pedicle, probably either deriving from the facet arthropathy or indicating a stress phenomenon. No vertebral body compression fracture.   T10-11: Mild bulging of the disc towards the left. Bilateral facet degeneration. No compressive stenosis.   T11-12: No disc abnormality. Bilateral facet degeneration and hypertrophy, worse on the right. No compressive stenosis.   T12-L1: Mild bulging of the disc.  No compressive stenosis.   IMPRESSION: 1. Acute or subacute inferior endplate fracture at T9 with loss of height of 20%. No retropulsed bone. This could be a cause of regional back pain. 2. 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 Bilateral foraminal stenosis that could affect either or both T9 nerves. Findings could also relate to regional back pain. 3. Old healed superior endplate fracture at T12, unchanged since 2020. 4. Edema within the right pedicle at T10, probably either deriving from the facet arthropathy or indicating a stress phenomenon. 5. 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     Electronically Signed   By: Oneil Officer M.D.   On: 08/02/2022  10:16  PATIENT SURVEYS:  FOTO 53/100 with target of 64   SCREENING FOR RED FLAGS: Bowel or bladder incontinence: No Spinal tumors: No Cauda equina syndrome: No  Compression fracture: No Abdominal aneurysm: No  COGNITION: Overall cognitive status: Within functional limits for tasks assessed     SENSATION: Numbness and tingling in bilateral feet that do not radiate up to low back   MUSCLE LENGTH: Hamstrings: Right 90 deg; Left 90 deg Thomas test: Negative bilaterally   POSTURE: rounded shoulders and forward head  PALPATION:  No tenderness to palpation   UE ROM:     Active ROM Right eval Left eval  Shoulder flexion    Shoulder extension    Shoulder abduction    Shoulder adduction    Shoulder internal rotation    Shoulder external rotation    Elbow flexion    Elbow extension    Wrist flexion    Wrist extension    Wrist ulnar deviation    Wrist radial deviation    Wrist pronation    Wrist supination    (Blank rows = not tested)        UE MMT:   Active ROM Right eval Left eval  Shoulder flexion 4+ 4+  Shoulder extension    Shoulder abduction 4+ 4+  Shoulder adduction    Shoulder internal rotation 4 4  Shoulder external rotation 4 4  Mid Trap  NT  NT   Lower Trap  NT  NT  Elbow flexion    Elbow extension    Wrist flexion    Wrist extension    Wrist ulnar deviation    Wrist radial deviation    Wrist pronation    Wrist supination    (Blank rows = not tested)   LUMBAR ROM:   AROM eval  Flexion 100%  Extension 100%  Right lateral flexion 100%  Left lateral flexion 100%  Right rotation 100%  Left rotation 100%   (Blank rows = not tested)  LOWER EXTREMITY ROM:     Active  Right eval Left eval  Hip flexion    Hip extension    Hip abduction    Hip adduction    Hip internal rotation    Hip external rotation    Knee flexion    Knee extension    Ankle dorsiflexion    Ankle plantarflexion    Ankle inversion    Ankle eversion      (Blank rows = not tested)  LOWER EXTREMITY MMT:    MMT Right eval Left eval Right 12/24/22 Left 12/24/22  Hip flexion 4 4 4  4+  Hip extension 4 4 4 4   Hip abduction 4- 4- 4+ 4  Hip adduction 4- 4- 4- 4-  Hip internal rotation      Hip external rotation      Knee flexion 4 4    Knee extension 4 4    Ankle dorsiflexion 4 4    Ankle plantarflexion      Ankle inversion      Ankle eversion       (Blank rows = not tested)  LUMBAR SPECIAL TESTS:  Straight leg raise test: Negative, FABER test: Negative, Thomas test: Negative, and FADIR Negative   FUNCTIONAL TESTS:  None performed   GAIT: Distance walked: 50 ft  Assistive device utilized: None Level of assistance: Complete Independence Comments: Decreased stride length and step length bilaterally  TODAY'S TREATMENT:  DATE:  01/24/23:  Nu-Step with seat at 10 and resistance at 2 for 5 min Mini squat with 8# DB at 90 degree shoulder flexion 3 x 10  Leg press 55# x 10; 65# x 10; 75# x 10 OMEGA seated row 45# x 10; 55# 2 x 10  OMEGA seated chest press 35# x 10 - too difficult; 25# x 5; 20# 2 x 10  OMEGA standing paloff press 10# 3 x 10 each direction  TRX lunge with B UE support 2 x 10 - leading with each LE   01/18/23: Nu-Step with seat at 10 and resistance at 2 for 5 min Squat 3 x 10 with shoulder flexion to 2 x #5 DB  -min VC to bring weight back onto heels Reverse Lunges with BUE support with RLE  -min VC to maintain up Reverse Lunges with 1 UE  support for LLE 1 x 10 Reverse Lunges with BUE support with RLE #5 Ankle Weight  1 x 10  Reverse Lunges with 1 UE support with 1 UE support RLE #5 Ankle Weight 1 x 10  Reverse Lunges with BUE support with RLE #5 Ankle Weight x 2-  1 x 10  Reverse Lunges with 1 UE support with 1 UE support RLE #5 Ankle Weight  x 2 1 x 10  Reverse Lunges with BUE support with  RLE #5 Ankle Weight x 2 with RLE step up onto 6 inch step-  1 x 10   01/10/23: Nu-Step with seat at 10 and resistance at 2 for 5 min  Bird Dog with #3 lb AW on BLE 3 x 10  -RPE 5/10  Bird Dog with #3 lb AW on BLE and #3 DB 1 x 10  Mini-Squat with #5 lb DB X 2 - 3 x 10  Reverse Lunges with BUE support 1 x 10  -Pt reports experiencing crepitus  -min VC to decrease step length on right  Reverse Lunges with 1 UE support 1 x 10  Reverse Lunges with no UE support 1 x 10  Supine 90/90 Alternating heel taps with extended knee 1 X 10 -Pt reports increased low back pain  Supine 90/90 alternating heel taps with flexed knee 1 x 10   01/02/23: Elevated TM test for 9 min at 2 mph  -Elevation from 0 incline to 15  -RPE 6/10, HR 100  or 75 % of HR Max  Heel to [place] Test: Negative  Rapid Alternating Movements: Negative  Hoffman's Sign: Negative  Left Side Lying Hip Adduction with #5 lb ankle weight 1 x 10 Left Side Lying Hip Adduction with #3 lb ankle weight 2 x 10    12/27/22: Matrix Recumbent Bike with resistance of 5 min  HIP MMT: Hip Flex R/L 4/4+ Hip Abd R/L 4+/4 Hip Add R/L 4-/4-  Hip Ext R/l 4+/4+  Knee Flex R/L 4+/4+   Knee Ext  R/L 4+/4+  Reverse Marches with straight knee 1 x 10    PATIENT EDUCATION:  Education details: Form and technique for correct performance of exercise  Person educated: Patient Education method: Programmer, Multimedia, Demonstration, Verbal cues, and Handouts Education comprehension: verbalized understanding, returned demonstration, and verbal cues required  HOME EXERCISE PROGRAM: Access Code: [email or web address] URL: [email or web address] Date: 01/18/2023 Prepared by: Toribio Servant  Exercises - Prone Quad Stretch with Strap  - 3-4 x weekly - 3 reps - 60 sec  hold - Seated Lower Limb Slump Neural Mobilization  - 1 x daily - 4 sets - 10 reps -  Bird Dog  - 3-4 x weekly - 3 sets - 10 reps - Reverse Lunge on Slider  - 3-4 x weekly - 3 sets - 10 reps -  Mini Squat with Chair  - 3-4 x weekly - 3 sets - 10 reps - Supine 90/90 Alternating Toe Touch  - 3-4 x weekly - 3 sets - 5 reps - Sidelying Hip Adduction  - 3-4 x weekly - 3 sets - 10 reps  ASSESSMENT:  CLINICAL IMPRESSION:   Patient arrives to treatment session motivated to participate. Session focused on functional LE and core strengthening with increased resistance this date. Tolerated session well with no increase in back pain. He will continue to benefit from skilled PT to address these aforementioned deficits to improve posture and ability to stand and walk for longer periods of time to continue to maintain independence and well being like walking dog and getting groceries.  OBJECTIVE IMPAIRMENTS: hypomobility, impaired flexibility, postural dysfunction, and pain.   ACTIVITY LIMITATIONS: carrying, lifting, standing, and squatting  PARTICIPATION LIMITATIONS: shopping, community activity, occupation, and yard work  PERSONAL FACTORS: Age, Past/current experiences, and Low back pain are also affecting patient's functional outcome.   REHAB POTENTIAL: Good  CLINICAL DECISION MAKING: Stable/uncomplicated  EVALUATION COMPLEXITY: Low   GOALS: Goals reviewed with patient? No  SHORT TERM GOALS: Target date: 10/31/2022  PT reviewed the following HEP with patient with patient able to demonstrate a set of the following with min cuing for correction needed. PT educated patient on parameters of therex (how/when to inc/decrease intensity, frequency, rep/set range, stretch hold time, and purpose of therex) with verbalized understanding.  Baseline: NT 10/19/22 Performing independently  Goal status: ACHIEVED     LONG TERM GOALS: Target date: 12/26/2022  Patient will have improved function and activity level as evidenced by an increase in FOTO score by 10 points or more.  Baseline: 53 with target of 63 : 11/24/22 = 63 Goal status: ACHIEVED   2.  Patient will demonstrate improved periscapular  strength by (1/3 grade MMT i.e 4- to 4) as evidence of improved posture and for offloading of anterior portion of thoracic vertebrae to prevent compression fractures and improve thoracic function.  Baseline: Mid Trap R/L 4/4, Rhomboid R/L 4/4, Lower Trap R/L 4-/4- , Shoulder Ext R/L 4/4  Goal status: DEFERRED   3.  Patient will demonstrate a wall to occiput test value of <2.0 cm as evidence of decreased risk of vertebral compression fracture in the setting of kyphosis.  Baseline: 8 inch (8 inch 11/24/22) Goal status: DEFERRED   4.  Patient will be able to carry out daily activities without >3/10 NRPS in thoracic spine as evidence of improve thoracic function.  Baseline: 4-5/10 NRPS - (0/10 at beginning of day, but increases to 5/10 by end) Goal status: DEFERRED   5. Pt will demonstrate improved hip and knee strength by 1/3 grade MMT (i.e. 4- to 4) to address feelings of weakness and instability when walking and performing ADLs  Baseline:  Hip flexion 4 4  Hip extension 4 4  Hip abduction 4- 4-  Hip adduction 4- 4-  Hip internal rotation    Hip external rotation    Knee flexion 4 4  Knee extension 4 4    12/27/22 Hip Flex R/L 4/4+ Hip Abd R/L 4+/4 Hip Add R/L 4-/4-  Hip Ext R/l 4+/4+  Knee Flex R/L 4+/4+   Knee Ext  R/L 4+/4+  Goal status: ONGOING   PLAN:  PT FREQUENCY: 1-2x/week  PT DURATION: 10 weeks  PLANNED INTERVENTIONS: Therapeutic exercises, Therapeutic activity, Neuromuscular re-education, Balance training, Gait training, Patient/Family education, Self Care, Joint mobilization, Joint manipulation, Aquatic Therapy, Dry Needling, Electrical stimulation, Spinal manipulation, Spinal mobilization, Cryotherapy, Moist heat, Traction, Manual therapy, and Re-evaluation.  PLAN FOR NEXT SESSION Continue to progress hip and abdominal strengthening exercises: planks with hip ext and dead lifts. Progress hip strengthening exercises with increased resistance with hip adduction  and/or other strengthening exercises to strengthen hip adduction.   Maryanne Finder, PT, DPT Physical Therapist - [place]  [facility]

## 2023-01-31 ENCOUNTER — Encounter: Payer: Medicare Other | Admitting: Physical Therapy

## 2023-02-07 ENCOUNTER — Ambulatory Visit: Payer: Medicare Other | Admitting: Physical Therapy

## 2023-02-07 DIAGNOSIS — G8929 Other chronic pain: Secondary | ICD-10-CM

## 2023-02-07 DIAGNOSIS — M5459 Other low back pain: Secondary | ICD-10-CM

## 2023-02-07 DIAGNOSIS — M546 Pain in thoracic spine: Secondary | ICD-10-CM

## 2023-02-07 NOTE — Therapy (Signed)
OUTPATIENT PHYSICAL THERAPY THORACOLUMBAR DISCHARGE    Patient Name: Patrick Wu MRN: [id number] DOB:Jun 10, 1933, 89 y.o., male Today's Date: 02/07/2023  END OF SESSION:  PT End of Session - 02/07/23 1522     Visit Number 15    Number of Visits 20    Date for PT Re-Evaluation 02/27/23    Authorization Type Medicare 2024    Authorization - Visit Number 15    Authorization - Number of Visits 20    Progress Note Due on Visit 20    PT Start Time 1517    PT Stop Time 1600    PT Time Calculation (min) 43 min    Activity Tolerance Patient tolerated treatment well    Behavior During Therapy [facility] for tasks assessed/performed                  Past Medical History:  Diagnosis Date   Asthma    Basal cell carcinoma 03/26/2019   Nodular. Glabella. Mohs 07/02/2019   Basal cell carcinoma 02/18/2021   R upper back, [facility]   Basal cell carcinoma 08/26/2021   Right preauricular. Nodular and infiltrative. Mohs 12/29/2021   Past Surgical History:  Procedure Laterality Date   APPENDECTOMY     There are no active problems to display for this patient.   PCP:   REFERRING PROVIDER: Dr. Cristopher Peru  REFERRING DIAG: [phone] (ICD-10-CM) - Femoral neuropathy of lower extremity, bilateral  Rationale for Evaluation and Treatment: Rehabilitation  THERAPY DIAG:  Other low back pain  Pain in thoracic spine  Chronic pain of right knee  ONSET DATE: June 2024   SUBJECTIVE:                                                                                                                                                                                           SUBJECTIVE STATEMENT:   Patient states that he has continued to feel mid back from time to time. He is seeking out further evaluation of his thoracic spine    PERTINENT HISTORY:  Pt reports that he had a compression fracture in his thoracic spine (T9) back in early June. He has since had resulting neuropathic pain that radiates  around to his chest. He has taken gabapentin for the pain which has helped some, but he now mostly takes it at night. He experiences most of his pain during afternoon when standing for long periods of time. If he sits for 5 minutes, then the pain decreases. He describes having a history of a right sided TIA where he lost control of his right leg. Pt was referred for neuropathy but this is unrelated  to his current mid back pain.   PAIN:  Are you having pain? Yes: NPRS scale: 1-2/10 Pain location: T9  Pain description: Dull and achy  Aggravating factors: Standing for long periods of time Relieving factors: Sitting down for long periods of time.   PRECAUTIONS: None  RED FLAGS: None   WEIGHT BEARING RESTRICTIONS: No  FALLS:  Has patient fallen in last 6 months? No  LIVING ENVIRONMENT: Lives with: lives with their spouse Lives in: House/apartment Stairs: No Has following equipment at home: None  OCCUPATION: Multimedia programmer and teaches classes for [employer]   PLOF: Independent  PATIENT GOALS: Decrease mid back pain and improve posture.   NEXT MD VISIT: November 2024   OBJECTIVE:  Note: Objective measures were completed at Evaluation unless otherwise noted.  VITALS: BP 167/82 HR 58 SpO2 98%   DIAGNOSTIC FINDINGS:  CLINICAL DATA:  Thoracic region back pain with radiation around the sides.   EXAM: MRI THORACIC SPINE WITHOUT CONTRAST   TECHNIQUE: Multiplanar, multisequence MR imaging of the thoracic spine was performed. No intravenous contrast was administered.   COMPARISON:  Chest CT 08/20/2018   FINDINGS: Alignment:  Mild scoliotic curvature.   Vertebrae: Acute or subacute inferior endplate fracture at T9 with loss of height of 20%. No retropulsed bone. Old healed superior endplate fracture at T12, unchanged since a chest CT in 2020.   Benign appearing hemangioma within the T7 vertebral body.   Cord:  No cord compression or focal cord lesion.   Paraspinal and other  soft tissues: Negative except for a hiatal hernia.   Disc levels:   No significant finding from T1-2 through T5-6.   T6-7: Small central disc protrusion indents the thecal sac slightly but does not cause any neural compression.   T7-8 and T8-9: No disc abnormality or significant stenosis. 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   Inferior endplate compression fracture at T9 as noted above with marrow edema. Bilateral facet joint effusions and posterior element edema. No compressive stenosis of the central canal. Bilateral foraminal stenosis that could affect either or both T9 nerves. Findings could certainly relate to regional back pain. No finding to suggest that this is anything other than a benign fracture. The T9-10 disc bulges minimally.   At T10, there is some edema within the right pedicle, probably either deriving from the facet arthropathy or indicating a stress phenomenon. No vertebral body compression fracture.   T10-11: Mild bulging of the disc towards the left. Bilateral facet degeneration. No compressive stenosis.   T11-12: No disc abnormality. Bilateral facet degeneration and hypertrophy, worse on the right. No compressive stenosis.   T12-L1: Mild bulging of the disc.  No compressive stenosis.   IMPRESSION: 1. Acute or subacute inferior endplate fracture at T9 with loss of height of 20%. No retropulsed bone. This could be a cause of regional back pain. 2. 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 Bilateral foraminal stenosis that could affect either or both T9 nerves. Findings could also relate to regional back pain. 3. Old healed superior endplate fracture at T12, unchanged since 2020. 4. Edema within the right pedicle at T10, probably either deriving from the facet arthropathy or indicating a stress phenomenon. 5. 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      Electronically Signed   By: Paulina Fusi M.D.   On: 08/02/2022 10:16  PATIENT SURVEYS:  FOTO 53/100 with target of 64   SCREENING FOR RED FLAGS: Bowel or  bladder incontinence: No Spinal tumors: No Cauda equina syndrome: No Compression fracture: No Abdominal aneurysm: No  COGNITION: Overall cognitive status: Within functional limits for tasks assessed     SENSATION: Numbness and tingling in bilateral feet that do not radiate up to low back   MUSCLE LENGTH: Hamstrings: Right 90 deg; Left 90 deg Thomas test: Negative bilaterally   POSTURE: rounded shoulders and forward head  PALPATION:  No tenderness to palpation   UE ROM:     Active ROM Right eval Left eval  Shoulder flexion    Shoulder extension    Shoulder abduction    Shoulder adduction    Shoulder internal rotation    Shoulder external rotation    Elbow flexion    Elbow extension    Wrist flexion    Wrist extension    Wrist ulnar deviation    Wrist radial deviation    Wrist pronation    Wrist supination    (Blank rows = not tested)        UE MMT:   Active ROM Right eval Left eval  Shoulder flexion 4+ 4+  Shoulder extension    Shoulder abduction 4+ 4+  Shoulder adduction    Shoulder internal rotation 4 4  Shoulder external rotation 4 4  Mid Trap  NT  NT   Lower Trap  NT  NT  Elbow flexion    Elbow extension    Wrist flexion    Wrist extension    Wrist ulnar deviation    Wrist radial deviation    Wrist pronation    Wrist supination    (Blank rows = not tested)   LUMBAR ROM:   AROM eval  Flexion 100%  Extension 100%  Right lateral flexion 100%  Left lateral flexion 100%  Right rotation 100%  Left rotation 100%   (Blank rows = not tested)  LOWER EXTREMITY ROM:     Active  Right eval Left eval  Hip flexion    Hip extension    Hip abduction    Hip adduction    Hip internal rotation    Hip external rotation    Knee flexion    Knee extension    Ankle dorsiflexion     Ankle plantarflexion    Ankle inversion    Ankle eversion     (Blank rows = not tested)  LOWER EXTREMITY MMT:    MMT Right eval Left eval Right 12/24/22 Left 12/24/22  Hip flexion 4 4 4  4+  Hip extension 4 4 4 4   Hip abduction 4- 4- 4+ 4  Hip adduction 4- 4- 4- 4-  Hip internal rotation      Hip external rotation      Knee flexion 4 4    Knee extension 4 4    Ankle dorsiflexion 4 4    Ankle plantarflexion      Ankle inversion      Ankle eversion       (Blank rows = not tested)  LUMBAR SPECIAL TESTS:  Straight leg raise test: Negative, FABER test: Negative, Thomas test: Negative, and FADIR Negative   FUNCTIONAL TESTS:  None performed   GAIT: Distance walked: 50 ft  Assistive device utilized: None Level of assistance: Complete Independence Comments: Decreased stride length and step length bilaterally  TODAY'S TREATMENT:  DATE:   02/07/23: Nu-Step with seat at 10 and resistance at 2 for 5 min Hip Add R/L 4-/4-  Reviewed home exercise plan  -[place] Split Squat with BUE support 2 x 10  01/24/23:  Nu-Step with seat at 10 and resistance at 2 for 5 min Mini squat with 8# DB at 90 degree shoulder flexion 3 x 10  Leg press 55# x 10; 65# x 10; 75# x 10 OMEGA seated row 45# x 10; 55# 2 x 10  OMEGA seated chest press 35# x 10 - too difficult; 25# x 5; 20# 2 x 10  OMEGA standing paloff press 10# 3 x 10 each direction  TRX lunge with B UE support 2 x 10 - leading with each LE   01/18/23: Nu-Step with seat at 10 and resistance at 2 for 5 min Squat 3 x 10 with shoulder flexion to 2 x #5 DB  -min VC to bring weight back onto heels Reverse Lunges with BUE support with RLE  -min VC to maintain up Reverse Lunges with 1 UE  support for LLE 1 x 10 Reverse Lunges with BUE support with RLE #5 Ankle Weight  1 x 10  Reverse Lunges with 1 UE support with 1  UE support RLE #5 Ankle Weight 1 x 10  Reverse Lunges with BUE support with RLE #5 Ankle Weight x 2-  1 x 10  Reverse Lunges with 1 UE support with 1 UE support RLE #5 Ankle Weight  x 2 1 x 10  Reverse Lunges with BUE support with RLE #5 Ankle Weight x 2 with RLE step up onto 6 inch step-  1 x 10   01/10/23: Nu-Step with seat at 10 and resistance at 2 for 5 min  Bird Dog with #3 lb AW on BLE 3 x 10  -RPE 5/10  Bird Dog with #3 lb AW on BLE and #3 DB 1 x 10  Mini-Squat with #5 lb DB X 2 - 3 x 10  Reverse Lunges with BUE support 1 x 10  -Pt reports experiencing crepitus  -min VC to decrease step length on right  Reverse Lunges with 1 UE support 1 x 10  Reverse Lunges with no UE support 1 x 10  Supine 90/90 Alternating heel taps with extended knee 1 X 10 -Pt reports increased low back pain  Supine 90/90 alternating heel taps with flexed knee 1 x 10    PATIENT EDUCATION:  Education details: Form and technique for correct performance of exercise  Person educated: Patient Education method: Explanation, Demonstration, Verbal cues, and Handouts Education comprehension: verbalized understanding, returned demonstration, and verbal cues required  HOME EXERCISE PROGRAM: Access Code: [email or web address] URL: [email or web address] Date: 02/07/2023 Prepared by: Ellin Goodie  Exercises - Prone Quad Stretch with Strap  - 3-4 x weekly - 3 reps - 60 sec  hold - Bird Dog  - 3-4 x weekly - 3 sets - 10 reps - Mini Squat with Chair  - 3-4 x weekly - 3 sets - 10 reps - Supine 90/90 Alternating Toe Touch  - 3-4 x weekly - 3 sets - 5-10 reps - [place] Split Squat  - 3-4 x weekly - 3 sets - 10 reps - Sidelying Hip Adduction  - 3-4 x weekly - 3 sets - 10 reps  ASSESSMENT:  CLINICAL IMPRESSION:   Patient has now met nearly all his rehab goals with improvement in hip strength and ongoing improvement in pain response to activity. His home exercise was  modified to include exercises that would  assist patient in maintaining functional and strength gains. Pt is now ready to discharge from PT to pursue home exercise plan independently.    OBJECTIVE IMPAIRMENTS: hypomobility, impaired flexibility, postural dysfunction, and pain.   ACTIVITY LIMITATIONS: carrying, lifting, standing, and squatting  PARTICIPATION LIMITATIONS: shopping, community activity, occupation, and yard work  PERSONAL FACTORS: Age, Past/current experiences, and Low back pain are also affecting patient's functional outcome.   REHAB POTENTIAL: Good  CLINICAL DECISION MAKING: Stable/uncomplicated  EVALUATION COMPLEXITY: Low   GOALS: Goals reviewed with patient? No  SHORT TERM GOALS: Target date: 10/31/2022  PT reviewed the following HEP with patient with patient able to demonstrate a set of the following with min cuing for correction needed. PT educated patient on parameters of therex (how/when to inc/decrease intensity, frequency, rep/set range, stretch hold time, and purpose of therex) with verbalized understanding.  Baseline: NT 10/19/22 Performing independently  Goal status: ACHIEVED     LONG TERM GOALS: Target date: 12/26/2022  Patient will have improved function and activity level as evidenced by an increase in FOTO score by 10 points or more.  Baseline: 53 with target of 63 : 11/24/22 = 63 Goal status: ACHIEVED   2.  Patient will demonstrate improved periscapular strength by (1/3 grade MMT i.e 4- to 4) as evidence of improved posture and for offloading of anterior portion of thoracic vertebrae to prevent compression fractures and improve thoracic function.  Baseline: Mid Trap R/L 4/4, Rhomboid R/L 4/4, Lower Trap R/L 4-/4- , Shoulder Ext R/L 4/4  Goal status: DEFERRED   3.  Patient will demonstrate a wall to occiput test value of <2.0 cm as evidence of decreased risk of vertebral compression fracture in the setting of kyphosis.  Baseline: 8 inch (8 inch 11/24/22) Goal status: DEFERRED   4.   Patient will be able to carry out daily activities without >3/10 NRPS in thoracic spine as evidence of improve thoracic function.  Baseline: 4-5/10 NRPS - (0/10 at beginning of day, but increases to 5/10 by end) Goal status: DEFERRED   5. Pt will demonstrate improved hip and knee strength by 1/3 grade MMT (i.e. 4- to 4) to address feelings of weakness and instability when walking and performing ADLs  Baseline:  Hip flexion 4 4  Hip extension 4 4  Hip abduction 4- 4-  Hip adduction 4- 4-  Hip internal rotation    Hip external rotation    Knee flexion 4 4  Knee extension 4 4    12/27/22 Hip Flex R/L 4/4+ Hip Abd R/L 4+/4 Hip Add R/L 4-/4-  Hip Ext R/l 4+/4+  Knee Flex R/L 4+/4+   Knee Ext  R/L 4+/4+  02/07/23: Hip Add R/L 4-/4-    Goal status: ACHIEVED    PLAN:  PT FREQUENCY: 1-2x/week  PT DURATION: 10 weeks  PLANNED INTERVENTIONS: Therapeutic exercises, Therapeutic activity, Neuromuscular re-education, Balance training, Gait training, Patient/Family education, Self Care, Joint mobilization, Joint manipulation, Aquatic Therapy, Dry Needling, Electrical stimulation, Spinal manipulation, Spinal mobilization, Cryotherapy, Moist heat, Traction, Manual therapy, and Re-evaluation.  PLAN FOR NEXT SESSION Discharge from PT.  Ellin Goodie PT, DPT  [facility] [address] [place], [address] Phone: [phone]   Fax:  [phone]

## 2023-03-16 ENCOUNTER — Ambulatory Visit: Payer: Medicare Other | Admitting: Dermatology

## 2023-03-20 ENCOUNTER — Encounter: Payer: Self-pay | Admitting: Dermatology

## 2023-03-20 ENCOUNTER — Ambulatory Visit (INDEPENDENT_AMBULATORY_CARE_PROVIDER_SITE_OTHER): Admitting: Dermatology

## 2023-03-20 DIAGNOSIS — Z1283 Encounter for screening for malignant neoplasm of skin: Secondary | ICD-10-CM | POA: Diagnosis not present

## 2023-03-20 DIAGNOSIS — L570 Actinic keratosis: Secondary | ICD-10-CM

## 2023-03-20 DIAGNOSIS — D229 Melanocytic nevi, unspecified: Secondary | ICD-10-CM

## 2023-03-20 DIAGNOSIS — L814 Other melanin hyperpigmentation: Secondary | ICD-10-CM

## 2023-03-20 DIAGNOSIS — W908XXA Exposure to other nonionizing radiation, initial encounter: Secondary | ICD-10-CM | POA: Diagnosis not present

## 2023-03-20 DIAGNOSIS — L820 Inflamed seborrheic keratosis: Secondary | ICD-10-CM

## 2023-03-20 DIAGNOSIS — Z85828 Personal history of other malignant neoplasm of skin: Secondary | ICD-10-CM

## 2023-03-20 DIAGNOSIS — L821 Other seborrheic keratosis: Secondary | ICD-10-CM

## 2023-03-20 DIAGNOSIS — L578 Other skin changes due to chronic exposure to nonionizing radiation: Secondary | ICD-10-CM | POA: Diagnosis not present

## 2023-03-20 DIAGNOSIS — D1801 Hemangioma of skin and subcutaneous tissue: Secondary | ICD-10-CM

## 2023-03-20 DIAGNOSIS — Z872 Personal history of diseases of the skin and subcutaneous tissue: Secondary | ICD-10-CM

## 2023-03-20 NOTE — Patient Instructions (Addendum)

## 2023-03-20 NOTE — Progress Notes (Signed)
 Follow-Up Visit   Subjective  Patrick Wu is a 89 y.o. male who presents for the following: Skin Cancer Screening and Full Body Skin Exam Hx of bcc, hx of aks, hx of isks   The patient presents for Total-Body Skin Exam (TBSE) for skin cancer screening and mole check. The patient has spots, moles and lesions to be evaluated, some may be new or changing and the patient may have concern these could be cancer.  The following portions of the chart were reviewed this encounter and updated as appropriate: medications, allergies, medical history  Review of Systems:  No other skin or systemic complaints except as noted in HPI or Assessment and Plan.  Objective  Well appearing patient in no apparent distress; mood and affect are within normal limits.  A full examination was performed including scalp, head, eyes, ears, nose, lips, neck, chest, axillae, abdomen, back, buttocks, bilateral upper extremities, bilateral lower extremities, hands, feet, fingers, toes, fingernails, and toenails. All findings within normal limits unless otherwise noted below.   Relevant physical exam findings are noted in the Assessment and Plan.  face and ears x 9 (9) Erythematous thin papules/macules with gritty scale.  b/l arms and hands x 7 (7) Erythematous stuck-on, waxy papule or plaque  Assessment & Plan   SKIN CANCER SCREENING PERFORMED TODAY.  ACTINIC DAMAGE - Chronic condition, secondary to cumulative UV/sun exposure - diffuse scaly erythematous macules with underlying dyspigmentation - Recommend daily broad spectrum sunscreen SPF 30+ to sun-exposed areas, reapply every 2 hours as needed.  - Staying in the shade or wearing long sleeves, sun glasses (UVA+UVB protection) and wide brim hats (4-inch brim around the entire circumference of the hat) are also recommended for sun protection.  - Call for new or changing lesions.  LENTIGINES, SEBORRHEIC KERATOSES, HEMANGIOMAS - Benign normal skin lesions -  Benign-appearing - Call for any changes  MELANOCYTIC NEVI - Tan-brown and/or pink-flesh-colored symmetric macules and papules - Benign appearing on exam today - Observation - Call clinic for new or changing moles - Recommend daily use of broad spectrum spf 30+ sunscreen to sun-exposed areas.   HISTORY OF BASAL CELL CARCINOMA OF THE SKIN.  03/26/2019 Glabella, Mohs 07/02/2019. 02/18/2021 Right upper back, [facility]  08/26/2021 Right preauricular, Mohs 12/29/2021.  - No evidence of recurrence today - Recommend regular full body skin exams - Recommend daily broad spectrum sunscreen SPF 30+ to sun-exposed areas, reapply every 2 hours as needed.  - Call if any new or changing lesions are noted between office visits  ACTINIC KERATOSIS (9) face and ears x 9 (9) Actinic keratoses are precancerous spots that appear secondary to cumulative UV radiation exposure/sun exposure over time. They are chronic with expected duration over 1 year. A portion of actinic keratoses will progress to squamous cell carcinoma of the skin. It is not possible to reliably predict which spots will progress to skin cancer and so treatment is recommended to prevent development of skin cancer.  Recommend daily broad spectrum sunscreen SPF 30+ to sun-exposed areas, reapply every 2 hours as needed.  Recommend staying in the shade or wearing long sleeves, sun glasses (UVA+UVB protection) and wide brim hats (4-inch brim around the entire circumference of the hat). Call for new or changing lesions. Destruction of lesion - face and ears x 9 (9) INFLAMED SEBORRHEIC KERATOSIS (7) b/l arms and hands x 7 (7) Symptomatic, irritating, patient would like treated. Destruction of lesion - b/l arms and hands x 7 (7) Complexity: simple   Destruction  method: cryotherapy   Informed consent: discussed and consent obtained   Timeout:  patient name, date of birth, surgical site, and procedure verified Lesion destroyed using liquid nitrogen: Yes    Region frozen until ice ball extended beyond lesion: Yes   Outcome: patient tolerated procedure well with no complications   Post-procedure details: wound care instructions given   Return in about 1 year (around 03/19/2024) for TBSE.  IAsher Muir, CMA, am acting as scribe for Armida Sans, MD.   Documentation: I have reviewed the above documentation for accuracy and completeness, and I agree with the above.  Armida Sans, MD

## 2023-08-23 ENCOUNTER — Other Ambulatory Visit
Admission: RE | Admit: 2023-08-23 | Discharge: 2023-08-23 | Disposition: A | Discharge status: Home / Self Care | Source: Ambulatory Visit | Attending: Internal Medicine | Admitting: Internal Medicine

## 2023-08-23 DIAGNOSIS — E782 Mixed hyperlipidemia: Secondary | ICD-10-CM | POA: Insufficient documentation

## 2023-08-23 LAB — CBC WITH DIFFERENTIAL/PLATELET
Abs Immature Granulocytes: 0.05 K/uL (ref 0.00–0.07)
Basophils Absolute: 0.1 K/uL (ref 0.0–0.1)
Basophils Relative: 1 %
Eosinophils Absolute: 0.6 K/uL — ABNORMAL HIGH (ref 0.0–0.5)
Eosinophils Relative: 5 %
HCT: 42.1 % (ref 39.0–52.0)
Hemoglobin: 14.2 g/dL (ref 13.0–17.0)
Immature Granulocytes: 0 %
Lymphocytes Relative: 49 %
Lymphs Abs: 6.4 K/uL — ABNORMAL HIGH (ref 0.7–4.0)
MCH: 32.9 pg (ref 26.0–34.0)
MCHC: 33.7 g/dL (ref 30.0–36.0)
MCV: 97.5 fL (ref 80.0–100.0)
Monocytes Absolute: 0.7 K/uL (ref 0.1–1.0)
Monocytes Relative: 6 %
Neutro Abs: 5 K/uL (ref 1.7–7.7)
Neutrophils Relative %: 39 %
Platelets: 286 K/uL (ref 150–400)
RBC: 4.32 MIL/uL (ref 4.22–5.81)
RDW: 12.6 % (ref 11.5–15.5)
Smear Review: NORMAL
WBC: 12.8 K/uL — ABNORMAL HIGH (ref 4.0–10.5)
nRBC: 0 % (ref 0.0–0.2)

## 2024-01-01 ENCOUNTER — Ambulatory Visit: Admitting: Dermatology

## 2024-01-08 ENCOUNTER — Ambulatory Visit: Attending: Orthopedic Surgery

## 2024-01-08 DIAGNOSIS — M5459 Other low back pain: Secondary | ICD-10-CM | POA: Diagnosis present

## 2024-01-08 NOTE — Progress Notes (Signed)
 " OUTPATIENT PHYSICAL THERAPY THORACOLUMBAR EVALUATION   Patient Name: Patrick Wu MRN: [id number] DOB:[birth year], 89 y.o., male Today's Date: 01/08/2024  END OF SESSION:  PT End of Session - 01/08/24 1313     Visit Number 1    Number of Visits 17    Date for Recertification  03/04/24    Authorization Type eval: 01/08/24    PT Start Time 1315    PT Stop Time 1400    PT Time Calculation (min) 45 min    Activity Tolerance Patient tolerated treatment well    Behavior During Therapy [facility] for tasks assessed/performed         Past Medical History:  Diagnosis Date   Asthma    Basal cell carcinoma 03/26/2019   Nodular. Glabella. Mohs 07/02/2019   Basal cell carcinoma 02/18/2021   R upper back, EDC   Basal cell carcinoma 08/26/2021   Right preauricular. Nodular and infiltrative. Mohs 12/29/2021   Past Surgical History:  Procedure Laterality Date   APPENDECTOMY     There are no active problems to display for this patient.  PCP: Cleotilde Oneil FALCON, MD  REFERRING PROVIDER: Rod Eleanor HERO, MD  REFERRING DIAG: [phone] (ICD-10-CM) - Spinal stenosis, lumbar region with neurogenic claudication R26.81 (ICD-10-CM) - Unsteadiness on feet  RATIONALE FOR EVALUATION AND TREATMENT: Rehabilitation  THERAPY DIAG: Other low back pain  ONSET DATE: 06/2022  FOLLOW-UP APPT SCHEDULED WITH REFERRING PROVIDER: Yes    SUBJECTIVE:                                                                                                                                                                                         SUBJECTIVE STATEMENT:  Mid back radiculitis with truncal weakness  PERTINENT HISTORY:  Pt had a T9 compression fracture 06/2022 with ongoing pain since that time.Pain occurs in lower to mid thoracic spine and radiates bilaterally around his ribs anteriorly. Pain does not radiate down to hips/BLE. He reports weakness in his trunk muscles as well as leg muscles when the pain occurs.  BLE weakness also results in some gait instability which worsens as the day progresses but no foot drop reported. He denies any BLE numbness/tingling or saddle paresthesia but does report what he feels like are proprioceptive deficits. He has tried spinal injections without relief. He underwent a nerve conduction study but did not receive a full explanation of the findings. He was referred to physical therapy for peripheral neuropathy last year. He completed physical therapy but no significant improvement. Pt has a history of chronic lumbar pain with central canal stenosis but the low back pain has actually improved since his thoracic pain  has increased. He takes Gabapentin at night which has helped him sleep. 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 Cleotilde suspects CLL as well. Per MD note, his physician is considering an epidural steroid injection distal to the site of maximal stenosis seen at L3-4 (injection at L4-5).   Per MD notes:  Spine Imaging: MRI of the cervical spine shows evidence of multilevel degenerative disc disease and widespread facet arthrosis. There is also evidence of moderate to severe foraminal narrowing at C3-4. MRI of the thoracic spine shows degenerative disc disease throughout as well as disc bulging at T5-6 and T6-7. There is also evidence of foraminal narrowing at T10/11. In the lumbar spine, evidence of L2-3 retrolisthesis, as well as right foraminal narrowing and spinal canal narrowing at L2-3, L3-4 and L4-5.  PAIN:    Pain Intensity: Present: 0/10, Best: 0/10, Worst: 8/10 Pain location: mid thoracic mostly, occasionally in lumbar spine Pain Quality: Dull, worse on the R side; Radiating: Yes, radiates around ribs bilaterally with electrical pain Numbness/Tingling: Yes occasional numbness in feet with prolonged sitting but typically no numbness. Denies saddle paresthesia. Loss of proprioception in feet Focal Weakness: Yes, occasional BLE weakness; Aggravating  factors: extended standing Relieving factors: sitting, laying down; 24-hour pain behavior: worsens as day progresses How long can you stand: History of prior back injury, pain, surgery, or therapy: Yes, history of chronic low back pain. T9 compression fracture 2024; Imaging: Yes, see history Red flags: Positive: T9 compression fracture, MGUS and possible CLL, Negative: bowel/bladder changes, saddle paresthesia, h/o abdominal aneurysm, abdominal pain, chills/fever, night sweats, unexplained weight gain/loss;  PRECAUTIONS: Back and Fall (History of T9 compression fracture)  WEIGHT BEARING RESTRICTIONS: No  FALLS: Has patient fallen in last 6 months? 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  2. Personal care (washing, dressing, etc.) 0 =  I can take care of myself normally without causing increased pain.  3. Lifting 2 = Pain prevents me from lifting heavy weights off the floor,but I can manage if the weights are conveniently positioned (e.g. on a table)  4. Walking 1 = Pain prevents me from walking more than 1 mile.  5. Sitting 0 =  I can sit in any chair as long as I like.  6. Standing 1 =  I can stand as long as I want but, it increases my pain.  7. Sleeping 1 = I can sleep well only by using pain medication.  8. Social Life 0 = My social life is normal and does not increase my pain.  9. Traveling 0 =  I can travel anywhere without  increased pain.  10. Employment/ Homemaking 1 = My normal homemaking/job activities increase my pain, but I can still perform all that is required of me  Total 8/50 (16%)   Cognition Patient is oriented to person, place, and time.  Recent memory is intact.  Remote memory is intact.  Attention span and concentration are intact.  Expressive speech is intact.  Patient's fund of knowledge is within normal limits for educational level.    Gross Musculoskeletal Assessment Tremor: None Bulk: Normal Tone: Normal No visible step-off along spinal column, no signs of scoliosis  GAIT: Pt ambulates without assistive device. R shoulder lower than L and decreased trunk rotation noted. Mild foot pronation bilaterally but not excessive. Gait speed WNL  Posture: Lumbar lordosis: Decreased natural lordosis Iliac crest height: Equal bilaterally Lumbar lateral shift: Negative  AROM AROM (Normal range in degrees) AROM   Lumbar   Flexion (65) Severely decreased segmental flexion  Extension (30) Severely decreased segmental   Right lateral flexion (25) Moderate to severe loss  Left lateral flexion (25) Moderate to severe loss  Right rotation (30) Mod loss  Left rotation (30) Mod loss      Hip Right Left  Flexion (125) WNL WNL  Extension (15)    Abduction (40)    Adduction     Internal Rotation (45) Decreased Decreased  External Rotation (45) WNL WNL      Knee    Flexion (135) WNL WNL  Extension (0) WNL WNL  (* = pain; Blank rows = not tested)  LE MMT: MMT (out of 5) Right  Left   Hip flexion 5 5  Hip extension    Hip abduction (sitting) 5 5  Hip adduction    Hip internal rotation 5 5  Hip external rotation 5 4+  Knee flexion 5 5  Knee extension 5 5  Ankle dorsiflexion 5 5  Ankle plantarflexion Active Active  (* = pain; Blank rows = not tested)  Sensation Grossly intact to light touch throughout bilateral LEs as determined by testing dermatomes L2-S2. Proprioception in great  toe WNL BLE. Stereognosis and hot/cold testing deferred on this date.  Reflexes Deferred  Muscle Length Hamstrings: R: Negative L: Negative Ely (quadriceps): R: Not examined L: Not examined Thomas (hip flexors): R: Not examined L: Not examined Ober: R: Not examined L: Not examined  Palpation Deferred  Passive Accessory Motion Deferred  Special Tests Lumbar Radiculopathy and Discogenic: Centralization and Peripheralization (SN 92, -LR 0.12): Not examined Slump (SN 83, -LR 0.32): R: Not examined L: Not examined SLR (SN 92, -LR 0.29): R: Negative L:  Negative Crossed SLR (SP 90): R: Negative L: Negative  Facet Joint: Extension-Rotation (SN 100, -LR 0.0): R: Not examined L: Not examined  Lumbar Foraminal Stenosis: Lumbar quadrant (SN 70): R: Negative L: Negative  Hip: FABER (SN 81): R: Negative L: Negative FADIR (SN 94): R: Negative L: Negative Hip scour (SN 50): R: Negative L: Negative  Functional Tasks Deferred  Beighton scale Deferred  Functional Outcome Measures  Results Comments  BERG 56/56 WNL  DGI    FGA    TUG 7.8 seconds WNL  5TSTS 11.5 seconds WNL  6 Minute Walk Test    10 Meter Gait Speed Self-selected: 9.6 s = 1.04 m/s; WNL  (Blank rows = not tested)   TODAY'S TREATMENT: Deferred   PATIENT EDUCATION:  Education details: Plan of care and examination findings Person educated: Patient Education method: Explanation Education comprehension: verbalized understanding   HOME EXERCISE PROGRAM:  Last prescribed HEP by Toribio Servant DTaP: Access Code: [email or web address] URL: [email or web address] Date: 02/07/2023 Prepared by: Toribio Servant   Exercises - Prone Quad Stretch with Strap  - 3-4 x weekly - 3 reps - 60 sec  hold - Bird Dog  - 3-4 x weekly - 3 sets - 10 reps - Mini Squat with Chair  - 3-4 x  weekly - 3 sets - 10 reps - Supine 90/90 Alternating Toe Touch  - 3-4 x weekly - 3 sets - 5-10 reps - Bulgarian Split Squat  - 3-4 x weekly -  3 sets - 10 reps - Sidelying Hip Adduction  - 3-4 x weekly - 3 sets - 10 reps   ASSESSMENT:  CLINICAL IMPRESSION: Patient is a 89 y.o. male who was seen today for physical therapy evaluation and treatment for trunk and gait instability related to thoracic radiculitis. Overall pt did quite well on outcome measures completed during evaluation. Plan will be to perform additional high level balance testing with extensive HEP review at first follow-up session.   OBJECTIVE IMPAIRMENTS: decreased strength, postural dysfunction, and pain.   ACTIVITY LIMITATIONS: lifting, standing, and sleeping  PARTICIPATION LIMITATIONS: community activity  PERSONAL FACTORS: Age, Past/current experiences, Time since onset of injury/illness/exacerbation, and 1-2 comorbidities: thoracic radiculitis, DDD, and MGUS are also affecting patient's functional outcome.   REHAB POTENTIAL: Good  CLINICAL DECISION MAKING: Stable/uncomplicated  EVALUATION COMPLEXITY: Low   GOALS: Goals reviewed with patient? No  SHORT TERM GOALS: Target date: 02/05/2024  Pt will be independent with HEP in order to improve strength and decrease back pain to improve pain-free function at home and with leisure activities Baseline:  Goal status: INITIAL   LONG TERM GOALS: Target date: 03/04/2024  Pt will report 50% reduction in episodes of trunk instability/weakness in order to improve his safety/confidence with ambulation;  Baseline:  Goal status: INITIAL  2.  Pt will demonstrate safe lifting mechanics in order to reduce stress on lumbar and thoracic spine and improve his ability to exercise at home. Baseline:  Goal status: INITIAL  3.  Pt will improve ABC by at least 13% in order to demonstrate clinically significant improvement in balance confidence.     Baseline: To be completed Goal status: INITIAL  PLAN: PT FREQUENCY: 1-2x/week  PT DURATION: 8 weeks  PLANNED INTERVENTIONS: Therapeutic exercises, Therapeutic activity,  Neuromuscular re-education, Balance training, Gait training, Patient/Family education, Self Care, Vestibular training, Canalith repositioning, Orthotic/Fit training, DME instructions, Dry Needling, Electrical stimulation, Cryotherapy, Moist heat, Taping, Manual therapy, and Re-evaluation.  PLAN FOR NEXT SESSION: Have pt complete ABC, DGI/FGA, consider Mini BESTest, review/modify HEP for neutral spinal stabilization exercises;   Selinda BIRCH Real Cona PT, DPT, GCS  Antoneo Ghrist, PT 01/08/2024, 2:35 PM  "

## 2024-01-17 NOTE — Therapy (Signed)
 " OUTPATIENT PHYSICAL THERAPY THORACOLUMBAR TREATMENT     Patient Name: Patrick Wu MRN: [id number] DOB:04-14-1933, 89 y.o., male Today's Date: 01/08/2024   PT End of Session - 01/18/24 1143     Visit Number 2    Number of Visits 17    Date for Recertification  03/04/24    Authorization Type eval: 01/08/24    PT Start Time 1145    PT Stop Time 1230    PT Time Calculation (min) 45 min    Activity Tolerance Patient tolerated treatment well    Behavior During Therapy [facility] for tasks assessed/performed          Past Medical History:  Diagnosis Date   Asthma    Basal cell carcinoma 03/26/2019   Nodular. Glabella. Mohs 07/02/2019   Basal cell carcinoma 02/18/2021   R upper back, EDC   Basal cell carcinoma 08/26/2021   Right preauricular. Nodular and infiltrative. Mohs 12/29/2021    Past Surgical History:  Procedure Laterality Date   APPENDECTOMY       There are no active problems to display for this patient.   PCP: Cleotilde Oneil FALCON, MD   REFERRING PROVIDER: Rod Eleanor HERO, MD   REFERRING DIAG: [phone] (ICD-10-CM) - Spinal stenosis, lumbar region with neurogenic claudication R26.81 (ICD-10-CM) - Unsteadiness on feet   RATIONALE FOR EVALUATION AND TREATMENT: Rehabilitation   THERAPY DIAG: Other low back pain   ONSET DATE: 06/2022   FOLLOW-UP APPT SCHEDULED WITH REFERRING PROVIDER: Yes    FROM INITIAL EVALUATION SUBJECTIVE:                                                                                                                                                                                          SUBJECTIVE STATEMENT:  Mid back radiculitis with truncal weakness   PERTINENT HISTORY:  Pt had a T9 compression fracture 06/2022 with ongoing pain since that time.Pain occurs in lower to mid thoracic spine and radiates bilaterally around his ribs anteriorly. Pain does not radiate down to hips/BLE. He reports weakness in his trunk muscles as well as leg muscles  when the pain occurs. BLE weakness also results in some gait instability which worsens as the day progresses but no foot drop reported. He denies any BLE numbness/tingling or saddle paresthesia but does report what he feels like are proprioceptive deficits. He has tried spinal injections without relief. He underwent a nerve conduction study but did not receive a full explanation of the findings. He was referred to physical therapy for peripheral neuropathy last year. He completed physical therapy but no significant improvement. Pt has a history of chronic lumbar pain with  central canal stenosis but the low back pain has actually improved since his thoracic pain has increased. He takes Gabapentin at night which has helped him sleep. 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 Cleotilde suspects CLL as well. Per MD note, his physician is considering an epidural steroid injection distal to the site of maximal stenosis seen at L3-4 (injection at L4-5).    Per MD notes:  Spine Imaging: MRI of the cervical spine shows evidence of multilevel degenerative disc disease and widespread facet arthrosis. There is also evidence of moderate to severe foraminal narrowing at C3-4. MRI of the thoracic spine shows degenerative disc disease throughout as well as disc bulging at T5-6 and T6-7. There is also evidence of foraminal narrowing at T10/11. In the lumbar spine, evidence of L2-3 retrolisthesis, as well as right foraminal narrowing and spinal canal narrowing at L2-3, L3-4 and L4-5.   PAIN:    Pain Intensity: Present: 0/10, Best: 0/10, Worst: 8/10 Pain location: mid thoracic mostly, occasionally in lumbar spine Pain Quality: Dull, worse on the R side; Radiating: Yes, radiates around ribs bilaterally with electrical pain Numbness/Tingling: Yes occasional numbness in feet with prolonged sitting but typically no numbness. Denies saddle paresthesia. Loss of proprioception in feet Focal Weakness: Yes, occasional BLE  weakness; Aggravating factors: extended standing Relieving factors: sitting, laying down; 24-hour pain behavior: worsens as day progresses How long can you stand: History of prior back injury, pain, surgery, or therapy: Yes, history of chronic low back pain. T9 compression fracture 2024; Imaging: Yes, see history Red flags: Positive: T9 compression fracture, MGUS and possible CLL, Negative: bowel/bladder changes, saddle paresthesia, h/o abdominal aneurysm, abdominal pain, chills/fever, night sweats, unexplained weight gain/loss;   PRECAUTIONS: Back and Fall (History of T9 compression fracture)   WEIGHT BEARING RESTRICTIONS: No   FALLS: Has patient fallen in last 6 months? 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  2. Personal care (washing, dressing, etc.) 0 =  I can take care of myself normally without causing increased pain.  3. Lifting 2 = Pain prevents me from lifting heavy weights off the floor,but I can manage if the weights are conveniently positioned (e.g. on a table)  4. Walking 1 = Pain prevents me from walking more than 1 mile.  5. Sitting 0 =  I can sit in any chair as long as I like.  6. Standing 1 =  I can stand as long as I want but, it increases my pain.  7. Sleeping 1 = I can sleep well only by using pain medication.  8. Social Life 0 = My social life is normal and does not increase my pain.  9.  Traveling 0 =  I can travel anywhere without increased pain.  10. Employment/ Homemaking 1 = My normal homemaking/job activities increase my pain, but I can still perform all that is required of me  Total 8/50 (16%)  Cognition Patient is oriented to person, place, and time.  Recent memory is intact.  Remote memory is intact.  Attention span and concentration are intact.  Expressive speech is intact.  Patient's fund of knowledge is within normal limits for educational level.                      Gross Musculoskeletal Assessment Tremor: None Bulk: Normal Tone: Normal No visible step-off along spinal column, no signs of scoliosis   GAIT: Pt ambulates without assistive device. R shoulder lower than L and decreased trunk rotation noted. Mild foot pronation bilaterally but not excessive. Gait speed WNL   Posture: Lumbar lordosis: Decreased natural lordosis Iliac crest height: Equal bilaterally Lumbar lateral shift: Negative   AROM     AROM (Normal range in degrees) AROM   Lumbar    Flexion (65) Severely decreased segmental flexion  Extension (30) Severely decreased segmental   Right lateral flexion (25) Moderate to severe loss  Left lateral flexion (25) Moderate to severe loss  Right rotation (30) Mod loss  Left rotation (30) Mod loss         Hip Right Left  Flexion (125) WNL WNL  Extension (15)      Abduction (40)      Adduction       Internal Rotation (45) Decreased Decreased  External Rotation (45) WNL WNL         Knee      Flexion (135) WNL WNL  Extension (0) WNL WNL  (* = pain; Blank rows = not tested)   LE MMT: MMT (out of 5) Right   Left    Hip flexion 5 5  Hip extension      Hip abduction (sitting) 5 5  Hip adduction      Hip internal rotation 5 5  Hip external rotation 5 4+  Knee flexion 5 5  Knee extension 5 5  Ankle dorsiflexion 5 5  Ankle plantarflexion Active Active  (* = pain; Blank rows = not tested)   Sensation Grossly intact to light  touch throughout bilateral LEs as determined by testing dermatomes L2-S2. Proprioception in great toe WNL BLE. Stereognosis and hot/cold testing deferred on this date.   Reflexes Deferred   Muscle Length Hamstrings: R: Negative L: Negative Ely (quadriceps): R: Not examined L: Not examined Thomas (hip flexors): R: Not examined L: Not examined Ober: R: Not examined L: Not examined   Palpation Deferred   Special Tests Lumbar Radiculopathy and Discogenic: Centralization and Peripheralization (SN 92, -LR 0.12): Not examined Slump (SN 83, -LR 0.32): R: Not examined L: Not examined SLR (SN 92, -LR 0.29): R: Negative L:  Negative Crossed SLR (SP 90): R: Negative L: Negative   Facet Joint: Extension-Rotation (SN 100, -LR 0.0): R: Not examined L: Not examined   Lumbar Foraminal Stenosis: Lumbar quadrant (SN 70): R: Negative L: Negative   Hip: FABER (SN 81): R: Negative L: Negative FADIR (SN 94): R: Negative L: Negative Hip scour (SN 50): R: Negative L: Negative  Functional Tasks Deferred   Beighton scale Deferred   Functional Outcome Measures   Results Comments  BERG 56/56 WNL  DGI      FGA      TUG 7.8 seconds WNL  5TSTS 11.5 seconds WNL  6 Minute Walk Test      10 Meter Gait Speed Self-selected: 9.6 s = 1.04 m/s; WNL  (Blank rows = not tested)     TODAY'S TREATMENT:  SUBJECTIVE: Pt reports that he is doing well today. No changes since the initial evaluation. He feels like his abdominals are weak today since getting up this morning. It is more common for him to feel the weakness as the day progresses. Denies resting pain. No specific questions or concerns.    PAIN: Denies   Ther-ex  Pt completed ABC: 92.5% Hooklying bridge marches with blue tband around knees 2 x 10; McGill curl up alternating extended LE 10s hold x 5 on each side; Pball bridges, arms across chest, 2 x 10; 90/90 alternating heel taps x 10 BLE; 90/90 bicycles 2 x 10 BLE; Quadruped bird dogs x  10 BLE; Hip abduction strength 4/5 bilaterally; Sidelying hip abduction x 10 BLE; HEP issued and reviewed with patient;     PATIENT EDUCATION:  Education details: Plan of care and HEP Person educated: Patient Education method: Explanation and handout Education comprehension: verbalized understanding and returned demonstration     HOME EXERCISE PROGRAM:  Access Code: [email or web address] URL: [email or web address] Date: 01/18/2024 Prepared by: Selinda Eck  Exercises - Bird Dog  - 1 x daily - 3-4 x weekly - 3 sets - 10 reps - 2s hold - Supine Bicycles  - 1 x daily - 3-4 x weekly - 3 sets - 10 reps - 2s hold - Clamshell with Resistance  - 1 x daily - 3-4 x weekly - 3 sets - 10 reps - 2s hold - Clamshell with Resistance (Mirrored)  - 1 x daily - 3-4 x weekly - 3 sets - 10 reps - 2s hold - Sidelying Hip Abduction with Resistance at Thighs  - 1 x daily - 3-4 x weekly - 3 sets - 10 reps - 2s hold - Sidelying Hip Abduction with Resistance at Thighs (Mirrored)  - 1 x daily - 3-4 x weekly - 3 sets - 10 reps - 2s hold     ASSESSMENT:   CLINICAL IMPRESSION: Initiated strengthening exercises during session today with patient. He demonstrates weakness in bilateral hip abductors. Issued HEP and reviewed with patient. Plan to progress stabilization strengthening at future sessions. Pt encouraged to follow-up as scheduled. Pt will benefit from PT services to address deficits in strength, balance, and mobility in order to return to full function at home and decrease his risk for falls.     OBJECTIVE IMPAIRMENTS: decreased strength, postural dysfunction, and pain.    ACTIVITY LIMITATIONS: lifting, standing, and sleeping   PARTICIPATION LIMITATIONS: community activity   PERSONAL FACTORS: Age, Past/current experiences, Time since onset of injury/illness/exacerbation, and 1-2 comorbidities: thoracic radiculitis, DDD, and MGUS are also affecting patient's functional outcome.    REHAB POTENTIAL:  Good   CLINICAL DECISION MAKING: Stable/uncomplicated   EVALUATION COMPLEXITY: Low     GOALS: Goals reviewed with patient? No   SHORT TERM GOALS: Target date: 02/05/2024   Pt will be independent with HEP in order to improve strength and decrease back pain to improve pain-free function at home and with leisure activities Baseline:  Goal status: INITIAL     LONG TERM GOALS: Target date: 03/04/2024   Pt will report 50% reduction in episodes of trunk instability/weakness in order to improve his safety/confidence with ambulation;  Baseline:  Goal status: INITIAL   2.  Pt will demonstrate safe lifting mechanics in order to reduce stress on lumbar and thoracic spine and improve his ability to exercise at home. Baseline:  Goal status: INITIAL   3.  Pt will improve ABC by at least 13% in order to  demonstrate clinically significant improvement in balance confidence.   Baseline: 01/18/24: 92.5% Goal status: DISCONTINUED  4.  Pt will increase strength of bilateral hip abduction by at least 1/2 MMT grade in order to demonstrate improvement in strength and function  Baseline: 01/18/24: 4/5 bilaterally Goal status: INITIAL   PLAN: PT FREQUENCY: 1-2x/week   PT DURATION: 8 weeks   PLANNED INTERVENTIONS: Therapeutic exercises, Therapeutic activity, Neuromuscular re-education, Balance training, Gait training, Patient/Family education, Self Care, Vestibular training, Canalith repositioning, Orthotic/Fit training, DME instructions, Dry Needling, Electrical stimulation, Cryotherapy, Moist heat, Taping, Manual therapy, and Re-evaluation.   PLAN FOR NEXT SESSION: lumbar/hip palpation, reflex testing, consider DGI/FGA/Mini BESTest, progress stabilization strengthening, review/modify HEP for neutral spinal stabilization exercises as necessary;     Lenor Provencher D Darriona Dehaas PT, DPT, GCS  Patrick Wu  01/18/2024 1:15 PM  "

## 2024-01-18 ENCOUNTER — Ambulatory Visit: Attending: Orthopedic Surgery

## 2024-01-18 ENCOUNTER — Ambulatory Visit

## 2024-01-18 DIAGNOSIS — G8929 Other chronic pain: Secondary | ICD-10-CM | POA: Insufficient documentation

## 2024-01-18 DIAGNOSIS — M5459 Other low back pain: Secondary | ICD-10-CM | POA: Insufficient documentation

## 2024-01-18 DIAGNOSIS — M25561 Pain in right knee: Secondary | ICD-10-CM | POA: Diagnosis present

## 2024-01-18 DIAGNOSIS — M546 Pain in thoracic spine: Secondary | ICD-10-CM | POA: Diagnosis present

## 2024-01-22 ENCOUNTER — Ambulatory Visit

## 2024-01-22 DIAGNOSIS — K130 Diseases of lips: Secondary | ICD-10-CM

## 2024-01-22 DIAGNOSIS — L219 Seborrheic dermatitis, unspecified: Secondary | ICD-10-CM | POA: Diagnosis not present

## 2024-01-22 MED ORDER — ZORYVE 0.3 % EX FOAM: 2.0000 g | Freq: Every day | CUTANEOUS | 5 refills | Status: AC

## 2024-01-22 MED ORDER — TACROLIMUS 0.1 % EX OINT: TOPICAL_OINTMENT | CUTANEOUS | 5 refills | Status: AC

## 2024-01-22 MED ORDER — KETOCONAZOLE 2 % EX CREA: TOPICAL_CREAM | CUTANEOUS | 5 refills | Status: AC

## 2024-01-22 NOTE — Progress Notes (Signed)
" °  °  Subjective   Patrick Wu is a 89 y.o. male who presents for the following: Rash. Patient is established patient   Today patient reports: Rash around the eyes and on the forehead; previously dx'd as seb derm. Patient is currently using Econazole cream and Hydrocortisone cream w/o relief.   Review of Systems:    No other skin or systemic complaints except as noted in HPI or Assessment and Plan.  The following portions of the chart were reviewed this encounter and updated as appropriate: medications, allergies, medical history  Relevant Medical History:  Personal history of non melanoma skin cancer - see medical history for full details   Objective  (SKPE) Well appearing patient in no apparent distress; mood and affect are within normal limits. Examination was performed of the: Focused Exam of: Face   Examination notable for: Seborrheic Dermatitis: Erythema and scaling of the scalp, ear canals, and central face > rest of face.  Examination limited by: Clothing and Patient deferred removal       Assessment & Plan  (SKAP)   Seborrheic dermatitis of the face Chronic and persistent condition with duration or expected duration over one year. Condition is symptomatic and bothersome to patient. Patient is flaring and not currently at treatment goal.  - Discussed diagnosis, typical course, and treatment options for this condition - Explained to the patient the chronic nature of this diagnosis - Start ketoconazole  cream bid to affected areas  Start tacrolimus  ointment 0.1% twice daily on affected areas of the face as needed for flares Start roflumilast  foam 0.3% twice daily as needed for flares  Given location of eruption, unsafe to use daily topical CS to area. Patient requires non- steroidal topical given high risk of dyspigmentation and atrophy from topical CS use in sensitive area.  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 Condition is symptomatic and bothersome to patient. Patient is flaring and not currently at treatment goal.  - Discussed diagnosis, treatment options including prescription medications, condition prognosis, risks/benefits of treatments and/or medications, alteratives, and side effects of treatment.  - Discussed condition, shape of mouth, predisposing factors, generally irritant or allergic (rarely infectious). Usually responds to desonide or hydrocortisone with antifungal cream  - Recommended using only vaseline for moisturization - start topical ketoconazole  cream BID to TID x 2-3 weeks Start tacrolimus  ointment 0.1% twice daily on affected areas of the face as needed for flares   Was sun protection counseling provided?: Yes   Level of service outlined above   Patient instructions (SKPI)   Procedures, orders, diagnosis for this visit:    There are no diagnoses linked to this encounter.  Return to clinic: No follow-ups on file.  I, Emerick Ege, CMA am acting as scribe for Lauraine JAYSON Kanaris, MD.   Documentation: I have reviewed the above documentation for accuracy and completeness, and I agree with the above.  Lauraine JAYSON Kanaris, MD  "

## 2024-01-22 NOTE — Patient Instructions (Signed)

## 2024-01-25 ENCOUNTER — Ambulatory Visit

## 2024-01-25 DIAGNOSIS — M5459 Other low back pain: Secondary | ICD-10-CM | POA: Diagnosis not present

## 2024-01-25 DIAGNOSIS — G8929 Other chronic pain: Secondary | ICD-10-CM

## 2024-01-25 DIAGNOSIS — M546 Pain in thoracic spine: Secondary | ICD-10-CM

## 2024-01-25 NOTE — Therapy (Signed)
 " OUTPATIENT PHYSICAL THERAPY THORACOLUMBAR TREATMENT     Patient Name: Patrick Wu MRN: [id number] DOB:03/24/1933, 89 y.o., male Today's Date: 01/08/2024   PT End of Session - 01/25/24 1446     Visit Number 3    Number of Visits 17    Date for Recertification  03/04/24    Authorization Type eval: 01/08/24    PT Start Time 1447    PT Stop Time 1530    PT Time Calculation (min) 43 min    Activity Tolerance Patient tolerated treatment well    Behavior During Therapy [facility] for tasks assessed/performed          Past Medical History:  Diagnosis Date   Asthma    Basal cell carcinoma 03/26/2019   Nodular. Glabella. Mohs 07/02/2019   Basal cell carcinoma 02/18/2021   R upper back, EDC   Basal cell carcinoma 08/26/2021   Right preauricular. Nodular and infiltrative. Mohs 12/29/2021    Past Surgical History:  Procedure Laterality Date   APPENDECTOMY       There are no active problems to display for this patient.   PCP: Cleotilde Oneil FALCON, MD   REFERRING PROVIDER: Rod Eleanor HERO, MD   REFERRING DIAG: [phone] (ICD-10-CM) - Spinal stenosis, lumbar region with neurogenic claudication R26.81 (ICD-10-CM) - Unsteadiness on feet   RATIONALE FOR EVALUATION AND TREATMENT: Rehabilitation   THERAPY DIAG: Other low back pain   ONSET DATE: 06/2022   FOLLOW-UP APPT SCHEDULED WITH REFERRING PROVIDER: Yes    FROM INITIAL EVALUATION SUBJECTIVE:                                                                                                                                                                                          SUBJECTIVE STATEMENT:  Mid back radiculitis with truncal weakness   PERTINENT HISTORY:  Pt had a T9 compression fracture 06/2022 with ongoing pain since that time.Pain occurs in lower to mid thoracic spine and radiates bilaterally around his ribs anteriorly. Pain does not radiate down to hips/BLE. He reports weakness in his trunk muscles as well as leg muscles  when the pain occurs. BLE weakness also results in some gait instability which worsens as the day progresses but no foot drop reported. He denies any BLE numbness/tingling or saddle paresthesia but does report what he feels like are proprioceptive deficits. He has tried spinal injections without relief. He underwent a nerve conduction study but did not receive a full explanation of the findings. He was referred to physical therapy for peripheral neuropathy last year. He completed physical therapy but no significant improvement. Pt has a history of chronic lumbar pain with  central canal stenosis but the low back pain has actually improved since his thoracic pain has increased. He takes Gabapentin at night which has helped him sleep. 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 Cleotilde suspects CLL as well. Per MD note, his physician is considering an epidural steroid injection distal to the site of maximal stenosis seen at L3-4 (injection at L4-5).    Per MD notes:  Spine Imaging: MRI of the cervical spine shows evidence of multilevel degenerative disc disease and widespread facet arthrosis. There is also evidence of moderate to severe foraminal narrowing at C3-4. MRI of the thoracic spine shows degenerative disc disease throughout as well as disc bulging at T5-6 and T6-7. There is also evidence of foraminal narrowing at T10/11. In the lumbar spine, evidence of L2-3 retrolisthesis, as well as right foraminal narrowing and spinal canal narrowing at L2-3, L3-4 and L4-5.   PAIN:    Pain Intensity: Present: 0/10, Best: 0/10, Worst: 8/10 Pain location: mid thoracic mostly, occasionally in lumbar spine Pain Quality: Dull, worse on the R side; Radiating: Yes, radiates around ribs bilaterally with electrical pain Numbness/Tingling: Yes occasional numbness in feet with prolonged sitting but typically no numbness. Denies saddle paresthesia. Loss of proprioception in feet Focal Weakness: Yes, occasional BLE  weakness; Aggravating factors: extended standing Relieving factors: sitting, laying down; 24-hour pain behavior: worsens as day progresses How long can you stand: History of prior back injury, pain, surgery, or therapy: Yes, history of chronic low back pain. T9 compression fracture 2024; Imaging: Yes, see history Red flags: Positive: T9 compression fracture, MGUS and possible CLL, Negative: bowel/bladder changes, saddle paresthesia, h/o abdominal aneurysm, abdominal pain, chills/fever, night sweats, unexplained weight gain/loss;   PRECAUTIONS: Back and Fall (History of T9 compression fracture)   WEIGHT BEARING RESTRICTIONS: No   FALLS: Has patient fallen in last 6 months? 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  2. Personal care (washing, dressing, etc.) 0 =  I can take care of myself normally without causing increased pain.  3. Lifting 2 = Pain prevents me from lifting heavy weights off the floor,but I can manage if the weights are conveniently positioned (e.g. on a table)  4. Walking 1 = Pain prevents me from walking more than 1 mile.  5. Sitting 0 =  I can sit in any chair as long as I like.  6. Standing 1 =  I can stand as long as I want but, it increases my pain.  7. Sleeping 1 = I can sleep well only by using pain medication.  8. Social Life 0 = My social life is normal and does not increase my pain.  9.  Traveling 0 =  I can travel anywhere without increased pain.  10. Employment/ Homemaking 1 = My normal homemaking/job activities increase my pain, but I can still perform all that is required of me  Total 8/50 (16%)  Cognition Patient is oriented to person, place, and time.  Recent memory is intact.  Remote memory is intact.  Attention span and concentration are intact.  Expressive speech is intact.  Patient's fund of knowledge is within normal limits for educational level.                      Gross Musculoskeletal Assessment Tremor: None Bulk: Normal Tone: Normal No visible step-off along spinal column, no signs of scoliosis   GAIT: Pt ambulates without assistive device. R shoulder lower than L and decreased trunk rotation noted. Mild foot pronation bilaterally but not excessive. Gait speed WNL   Posture: Lumbar lordosis: Decreased natural lordosis Iliac crest height: Equal bilaterally Lumbar lateral shift: Negative   AROM     AROM (Normal range in degrees) AROM   Lumbar    Flexion (65) Severely decreased segmental flexion  Extension (30) Severely decreased segmental   Right lateral flexion (25) Moderate to severe loss  Left lateral flexion (25) Moderate to severe loss  Right rotation (30) Mod loss  Left rotation (30) Mod loss         Hip Right Left  Flexion (125) WNL WNL  Extension (15)      Abduction (40)      Adduction       Internal Rotation (45) Decreased Decreased  External Rotation (45) WNL WNL         Knee      Flexion (135) WNL WNL  Extension (0) WNL WNL  (* = pain; Blank rows = not tested)   LE MMT: MMT (out of 5) Right   Left    Hip flexion 5 5  Hip extension      Hip abduction (sitting) 5 5  Hip adduction      Hip internal rotation 5 5  Hip external rotation 5 4+  Knee flexion 5 5  Knee extension 5 5  Ankle dorsiflexion 5 5  Ankle plantarflexion Active Active  (* = pain; Blank rows = not tested)   Sensation Grossly intact to light  touch throughout bilateral LEs as determined by testing dermatomes L2-S2. Proprioception in great toe WNL BLE. Stereognosis and hot/cold testing deferred on this date.   Reflexes Deferred   Muscle Length Hamstrings: R: Negative L: Negative Ely (quadriceps): R: Not examined L: Not examined Thomas (hip flexors): R: Not examined L: Not examined Ober: R: Not examined L: Not examined   Palpation Deferred   Special Tests Lumbar Radiculopathy and Discogenic: Centralization and Peripheralization (SN 92, -LR 0.12): Not examined Slump (SN 83, -LR 0.32): R: Not examined L: Not examined SLR (SN 92, -LR 0.29): R: Negative L:  Negative Crossed SLR (SP 90): R: Negative L: Negative   Facet Joint: Extension-Rotation (SN 100, -LR 0.0): R: Not examined L: Not examined   Lumbar Foraminal Stenosis: Lumbar quadrant (SN 70): R: Negative L: Negative   Hip: FABER (SN 81): R: Negative L: Negative FADIR (SN 94): R: Negative L: Negative Hip scour (SN 50): R: Negative L: Negative  Functional Tasks Deferred   Beighton scale Deferred   Functional Outcome Measures   Results Comments  BERG 56/56 WNL  DGI      FGA      TUG 7.8 seconds WNL  5TSTS 11.5 seconds WNL  6 Minute Walk Test      10 Meter Gait Speed Self-selected: 9.6 s = 1.04 m/s; WNL  (Blank rows = not tested)     TODAY'S TREATMENT:  SUBJECTIVE: Pt reports that he is doing well today. No changes since the last therapy session. He feels like he is not walking right upon arrival today.  It becomes more common for him to feel weakness as the day progresses and this is one of his latest scheduled appointments.    PAIN: Denies   Ther-ex  NuStep BLE only at start of session for BLE strengthening and ROM during interval history x 9 minutes; Hooklying bridges with blue tband around knees 2 x 20; Hooklying bent knee fall outs x 10 BLE, cues to maintain stable pelvis; 90/90 bicycles 2 x 15 BLE; Sidelying clams with manual resistance  from therapist x 10 BLE; Sidelying hip abduction with manual resistance from therapist x 10 BLE; Seated pallof press with blue tband 2 x 10 toward each side; Pball seated marches with 2s hold x 10 BLE; HEP updated to include front knee planks with instructions to progress to full front forearm planks as able;     PATIENT EDUCATION:  Education details: Exercise form/technique, updated HEP Person educated: Patient Education method: Explanation and handout Education comprehension: verbalized understanding and returned demonstration     HOME EXERCISE PROGRAM:  Access Code: [email or web address] URL: [email or web address] Date: 01/25/2024 Prepared by: Selinda Eck  Exercises - Bird Dog  - 1 x daily - 3-4 x weekly - 3 sets - 10 reps - 2s hold - Supine Bicycles  - 1 x daily - 3-4 x weekly - 3 sets - 10 reps - 2s hold - Clamshell with Resistance  - 1 x daily - 3-4 x weekly - 3 sets - 10 reps - 2s hold - Clamshell with Resistance (Mirrored)  - 1 x daily - 3-4 x weekly - 3 sets - 10 reps - 2s hold - Sidelying Hip Abduction with Resistance at Thighs  - 1 x daily - 3-4 x weekly - 3 sets - 10 reps - 2s hold - Sidelying Hip Abduction with Resistance at Thighs (Mirrored)  - 1 x daily - 3-4 x weekly - 3 sets - 10 reps - 2s hold - Plank on Knees  - 1 x daily - 3-4 x weekly - 3 reps - 30-45s hold     ASSESSMENT:   CLINICAL IMPRESSION: Progressed strengthening exercises during session today with patient. He is able to complete all exercises with fatigue and weakness noted during sidelying clams and straight leg hip abduction. Updated HEP to include front forearm planks on knees with instruction to progress to full front planks as able. Plan to progress stabilization strengthening at future sessions. Pt encouraged to follow-up as scheduled. He will benefit from PT services to address deficits in strength, balance, and mobility in order to return to improve function at home and decrease his risk for  falls.     OBJECTIVE IMPAIRMENTS: decreased strength, postural dysfunction, and pain.    ACTIVITY LIMITATIONS: lifting, standing, and sleeping   PARTICIPATION LIMITATIONS: community activity   PERSONAL FACTORS: Age, Past/current experiences, Time since onset of injury/illness/exacerbation, and 1-2 comorbidities: thoracic radiculitis, DDD, and MGUS are also affecting patient's functional outcome.    REHAB POTENTIAL: Good   CLINICAL DECISION MAKING: Stable/uncomplicated   EVALUATION COMPLEXITY: Low     GOALS: Goals reviewed with patient? No   SHORT TERM GOALS: Target date: 02/05/2024   Pt will be independent with HEP in order to improve strength and decrease back pain to improve pain-free function at home and with leisure activities Baseline:  Goal status: INITIAL     LONG  TERM GOALS: Target date: 03/04/2024   Pt will report 50% reduction in episodes of trunk instability/weakness in order to improve his safety/confidence with ambulation;  Baseline:  Goal status: INITIAL   2.  Pt will demonstrate safe lifting mechanics in order to reduce stress on lumbar and thoracic spine and improve his ability to exercise at home. Baseline:  Goal status: INITIAL   3.  Pt will improve ABC by at least 13% in order to demonstrate clinically significant improvement in balance confidence.   Baseline: 01/18/24: 92.5% Goal status: DISCONTINUED  4.  Pt will increase strength of bilateral hip abduction by at least 1/2 MMT grade in order to demonstrate improvement in strength and function  Baseline: 01/18/24: 4/5 bilaterally Goal status: INITIAL   PLAN: PT FREQUENCY: 1-2x/week   PT DURATION: 8 weeks   PLANNED INTERVENTIONS: Therapeutic exercises, Therapeutic activity, Neuromuscular re-education, Balance training, Gait training, Patient/Family education, Self Care, Vestibular training, Canalith repositioning, Orthotic/Fit training, DME instructions, Dry Needling, Electrical stimulation, Cryotherapy,  Moist heat, Taping, Manual therapy, and Re-evaluation.   PLAN FOR NEXT SESSION: lumbar/hip palpation, reflex testing, consider DGI/FGA/Mini BESTest, progress stabilization strengthening, review/modify HEP for neutral spinal stabilization exercises as necessary;     Breta Demedeiros D Lorren Splawn PT, DPT, GCS  Sharetta Ricchio  01/25/2024 4:16 PM  "

## 2024-02-01 ENCOUNTER — Ambulatory Visit

## 2024-02-01 DIAGNOSIS — M546 Pain in thoracic spine: Secondary | ICD-10-CM

## 2024-02-01 DIAGNOSIS — M5459 Other low back pain: Secondary | ICD-10-CM

## 2024-02-01 NOTE — Therapy (Signed)
 " OUTPATIENT PHYSICAL THERAPY THORACOLUMBAR TREATMENT     Patient Name: Patrick Wu MRN: [id number] DOB:1933-06-20, 89 y.o., male Today's Date: 01/08/2024   PT End of Session - 02/01/24 1314     Visit Number 4    Number of Visits 17    Date for Recertification  03/04/24    Authorization Type eval: 01/08/24    PT Start Time 1317    PT Stop Time 1359    PT Time Calculation (min) 42 min    Activity Tolerance Patient tolerated treatment well    Behavior During Therapy [facility] for tasks assessed/performed         Past Medical History:  Diagnosis Date   Asthma    Basal cell carcinoma 03/26/2019   Nodular. Glabella. Mohs 07/02/2019   Basal cell carcinoma 02/18/2021   R upper back, EDC   Basal cell carcinoma 08/26/2021   Right preauricular. Nodular and infiltrative. Mohs 12/29/2021    Past Surgical History:  Procedure Laterality Date   APPENDECTOMY       There are no active problems to display for this patient.   PCP: Patrick Oneil FALCON, Wu   REFERRING PROVIDER: Rod Eleanor HERO, Wu   REFERRING DIAG: [id number] (ICD-10-CM) - Spinal stenosis, lumbar region with neurogenic claudication R26.81 (ICD-10-CM) - Unsteadiness on feet   RATIONALE FOR EVALUATION AND TREATMENT: Rehabilitation   THERAPY DIAG: Other low back pain   ONSET DATE: 06/2022   FOLLOW-UP APPT SCHEDULED WITH REFERRING PROVIDER: Yes    FROM INITIAL EVALUATION SUBJECTIVE:                                                                                                                                                                                          SUBJECTIVE STATEMENT:  Mid back radiculitis with truncal weakness   PERTINENT HISTORY:  Pt had a T9 compression fracture 06/2022 with ongoing pain since that time.Pain occurs in lower to mid thoracic spine and radiates bilaterally around his ribs anteriorly. Pain does not radiate down to hips/BLE. He reports weakness in his trunk muscles as well as leg muscles when  the pain occurs. BLE weakness also results in some gait instability which worsens as the day progresses but no foot drop reported. He denies any BLE numbness/tingling or saddle paresthesia but does report what he feels like are proprioceptive deficits. He has tried spinal injections without relief. He underwent a nerve conduction study but did not receive a full explanation of the findings. He was referred to physical therapy for peripheral neuropathy last year. He completed physical therapy but no significant improvement. Pt has a history of chronic lumbar pain with central  canal stenosis but the low back pain has actually improved since his thoracic pain has increased. He takes Gabapentin at night which has helped him sleep. 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 Per Wu note, his physician is considering an epidural steroid injection distal to the site of maximal stenosis seen at L3-4 (injection at L4-5).    Per Wu notes:  Spine Imaging: MRI of the cervical spine shows evidence of multilevel degenerative disc disease and widespread facet arthrosis. There is also evidence of moderate to severe foraminal narrowing at C3-4. MRI of the thoracic spine shows degenerative disc disease throughout as well as disc bulging at T5-6 and T6-7. There is also evidence of foraminal narrowing at T10/11. In the lumbar spine, evidence of L2-3 retrolisthesis, as well as right foraminal narrowing and spinal canal narrowing at L2-3, L3-4 and L4-5.   PAIN:    Pain Intensity: Present: 0/10, Best: 0/10, Worst: 8/10 Pain location: mid thoracic mostly, occasionally in lumbar spine Pain Quality: Dull, worse on the R side; Radiating: Yes, radiates around ribs bilaterally with electrical pain Numbness/Tingling: Yes occasional numbness in feet with prolonged sitting but typically no numbness. Denies saddle paresthesia. Loss of proprioception in feet Focal Weakness: Yes, occasional BLE  weakness; Aggravating factors: extended standing Relieving factors: sitting, laying down; 24-hour pain behavior: worsens as day progresses How long can you stand: History of prior back injury, pain, surgery, or therapy: Yes, history of chronic low back pain. T9 compression fracture 2024; Imaging: Yes, see history Red flags: Positive: T9 compression fracture, MGUS and possible Wu, Negative: bowel/bladder changes, saddle paresthesia, h/o abdominal aneurysm, abdominal pain, chills/fever, night sweats, unexplained weight gain/loss;   PRECAUTIONS: Back and Fall (History of T9 compression fracture)   WEIGHT BEARING RESTRICTIONS: No   FALLS: Has patient fallen in last 6 months? 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  2. Personal care (washing, dressing, etc.) 0 =  I can take care of myself normally without causing increased pain.  3. Lifting 2 = Pain prevents me from lifting heavy weights off the floor,but I can manage if the weights are conveniently positioned (e.g. on a table)  4. Walking 1 = Pain prevents me from walking more than 1 mile.  5. Sitting 0 =  I can sit in any chair as long as I like.  6. Standing 1 =  I can stand as long as I want but, it increases my pain.  7. Sleeping 1 = I can sleep well only by using pain medication.  8. Social Life 0 = My social life is normal and does not increase my pain.  9.  Traveling 0 =  I can travel anywhere without increased pain.  10. Employment/ Homemaking 1 = My normal homemaking/job activities increase my pain, but I can still perform all that is required of me  Total 8/50 (16%)    Cognition  Patient is oriented to person, place, and time.  Recent memory is intact.  Remote memory is intact.  Attention span and concentration are intact.  Expressive speech is intact.  Patient's fund of knowledge is within normal limits for educational level.                      Gross Musculoskeletal Assessment Tremor: None Bulk: Normal Tone: Normal No visible step-off along spinal column, no signs of scoliosis   GAIT: Pt ambulates without assistive device. R shoulder lower than L and decreased trunk rotation noted. Mild foot pronation bilaterally but not excessive. Gait speed WNL   Posture: Lumbar lordosis: Decreased natural lordosis Iliac crest height: Equal bilaterally Lumbar lateral shift: Negative   AROM     AROM (Normal range in degrees) AROM   Lumbar    Flexion (65) Severely decreased segmental flexion  Extension (30) Severely decreased segmental   Right lateral flexion (25) Moderate to severe loss  Left lateral flexion (25) Moderate to severe loss  Right rotation (30) Mod loss  Left rotation (30) Mod loss         Hip Right Left  Flexion (125) WNL WNL  Extension (15)      Abduction (40)      Adduction       Internal Rotation (45) Decreased Decreased  External Rotation (45) WNL WNL         Knee      Flexion (135) WNL WNL  Extension (0) WNL WNL  (* = pain; Blank rows = not tested)   LE MMT: MMT (out of 5) Right   Left    Hip flexion 5 5  Hip extension      Hip abduction (sitting) 5 5  Hip adduction      Hip internal rotation 5 5  Hip external rotation 5 4+  Knee flexion 5 5  Knee extension 5 5  Ankle dorsiflexion 5 5  Ankle plantarflexion Active Active  (* = pain; Blank rows = not tested)   Sensation Grossly intact to light  touch throughout bilateral LEs as determined by testing dermatomes L2-S2. Proprioception in great toe WNL BLE. Stereognosis and hot/cold testing deferred on this date.   Reflexes Deferred   Muscle Length Hamstrings: R: Negative L: Negative Ely (quadriceps): R: Not examined L: Not examined Thomas (hip flexors): R: Not examined L: Not examined Ober: R: Not examined L: Not examined   Palpation Deferred   Special Tests Lumbar Radiculopathy and Discogenic: Centralization and Peripheralization (SN 92, -LR 0.12): Not examined Slump (SN 83, -LR 0.32): R: Not examined L: Not examined SLR (SN 92, -LR 0.29): R: Negative L:  Negative Crossed SLR (SP 90): R: Negative L: Negative   Facet Joint: Extension-Rotation (SN 100, -LR 0.0): R: Not examined L: Not examined   Lumbar Foraminal Stenosis: Lumbar quadrant (SN 70): R: Negative L: Negative   Hip: FABER (SN 81): R: Negative L: Negative FADIR (SN 94): R: Negative L: Negative Hip scour (SN 50): R: Negative L: Negative  Functional Tasks Deferred   Beighton scale Deferred   Functional Outcome Measures   Results Comments  BERG 56/56 WNL  DGI      FGA      TUG 7.8 seconds WNL  5TSTS 11.5 seconds WNL  6 Minute Walk Test      10 Meter Gait Speed Self-selected: 9.6 s = 1.04 m/s; WNL  (Blank rows = not tested)     TODAY'S TREATMENT:  SUBJECTIVE: Pt reports that he is doing well today. No changes since the last therapy session. No specific questions or concerns currently.    PAIN: Denies   Ther-ex  NuStep BLE only at start of session for BLE strengthening and ROM during interval history x 8 minutes; Standing Nautilus pallof press 30# 2 x 10, 40# 2 x 10; Nautilus standing lat pull down 95# x 20, 110# x 15; Nautilus standing shoulder extension from shoulder height 50# x 12, x 20; Hooklying bridge with marches blue tband around knees 2 x 15; 90/90 bicycles x 20, x 15 BLE; Quadruped fire hydrants with blue tband 2 x 10 BLE; HEP  updated to include swiss ball exercises;   Not performed: Hooklying bent knee fall outs x 10 BLE, cues to maintain stable pelvis; Sidelying clams with manual resistance from therapist x 10 BLE; Sidelying hip abduction with manual resistance from therapist x 10 BLE; Pball seated marches with 2s hold x 10 BLE;    PATIENT EDUCATION:  Education details: Exercise form/technique, updated HEP Person educated: Patient Education method: Explanation and handout Education comprehension: verbalized understanding and returned demonstration     HOME EXERCISE PROGRAM:  Access Code: [email or web address] URL: [email or web address] Date: 02/01/2024 Prepared by: Selinda Eck  Exercises - Bird Dog  - 1 x daily - 3-4 x weekly - 3 sets - 10 reps - 2s hold - Supine Bicycles  - 1 x daily - 3-4 x weekly - 3 sets - 10 reps - 2s hold - Clamshell with Resistance  - 1 x daily - 3-4 x weekly - 3 sets - 10 reps - 2s hold - Clamshell with Resistance (Mirrored)  - 1 x daily - 3-4 x weekly - 3 sets - 10 reps - 2s hold - Sidelying Hip Abduction with Resistance at Thighs  - 1 x daily - 3-4 x weekly - 3 sets - 10 reps - 2s hold - Sidelying Hip Abduction with Resistance at Thighs (Mirrored)  - 1 x daily - 3-4 x weekly - 3 sets - 10 reps - 2s hold - Plank on Knees  - 1 x daily - 3-4 x weekly - 3 reps - 30-45s hold - Swiss Ball March and Kick  - 1 x daily - 3-4 x weekly - 3 sets - 10 reps - Seated March with Opposite Arm Flexion on Swiss Ball  - 1 x daily - 3-4 x weekly - 3 sets - 10 reps     ASSESSMENT:   CLINICAL IMPRESSION: Progressed strengthening exercises during session today with patient. He is able to complete all exercises and challenged pt with Nautilus strengthening. Updated HEP to include swiss ball exercises. Plan to progress stabilization strengthening at future sessions. Pt encouraged to follow-up as scheduled. He will benefit from PT services to address deficits in strength, balance, and mobility in  order to return to improve function at home and decrease his risk for falls.     OBJECTIVE IMPAIRMENTS: decreased strength, postural dysfunction, and pain.    ACTIVITY LIMITATIONS: lifting, standing, and sleeping   PARTICIPATION LIMITATIONS: community activity   PERSONAL FACTORS: Age, Past/current experiences, Time since onset of injury/illness/exacerbation, and 1-2 comorbidities: thoracic radiculitis, DDD, and MGUS are also affecting patient's functional outcome.    REHAB POTENTIAL: Good   CLINICAL DECISION MAKING: Stable/uncomplicated   EVALUATION COMPLEXITY: Low     GOALS: Goals reviewed with patient? No   SHORT TERM GOALS: Target date: 02/05/2024   Pt will be independent with HEP in order to  improve strength and decrease back pain to improve pain-free function at home and with leisure activities Baseline:  Goal status: INITIAL     LONG TERM GOALS: Target date: 03/04/2024   Pt will report 50% reduction in episodes of trunk instability/weakness in order to improve his safety/confidence with ambulation;  Baseline:  Goal status: INITIAL   2.  Pt will demonstrate safe lifting mechanics in order to reduce stress on lumbar and thoracic spine and improve his ability to exercise at home. Baseline:  Goal status: INITIAL   3.  Pt will improve ABC by at least 13% in order to demonstrate clinically significant improvement in balance confidence.   Baseline: 01/18/24: 92.5% Goal status: DISCONTINUED  4.  Pt will increase strength of bilateral hip abduction by at least 1/2 MMT grade in order to demonstrate improvement in strength and function  Baseline: 01/18/24: 4/5 bilaterally Goal status: INITIAL   PLAN: PT FREQUENCY: 1-2x/week   PT DURATION: 8 weeks   PLANNED INTERVENTIONS: Therapeutic exercises, Therapeutic activity, Neuromuscular re-education, Balance training, Gait training, Patient/Family education, Self Care, Vestibular training, Canalith repositioning, Orthotic/Fit training,  DME instructions, Dry Needling, Electrical stimulation, Cryotherapy, Moist heat, Taping, Manual therapy, and Re-evaluation.   PLAN FOR NEXT SESSION: lumbar/hip palpation, reflex testing, consider DGI/FGA/Mini BESTest, progress stabilization strengthening, review/modify HEP for neutral spinal stabilization exercises as necessary;     Selinda BIRCH Smrithi Pigford PT, DPT, GCS  Danessa Mensch  02/01/2024 2:02 PM  "

## 2024-02-08 ENCOUNTER — Ambulatory Visit

## 2024-02-08 DIAGNOSIS — G8929 Other chronic pain: Secondary | ICD-10-CM

## 2024-02-08 DIAGNOSIS — M5459 Other low back pain: Secondary | ICD-10-CM

## 2024-02-08 DIAGNOSIS — M546 Pain in thoracic spine: Secondary | ICD-10-CM

## 2024-02-08 NOTE — Therapy (Signed)
 " OUTPATIENT PHYSICAL THERAPY THORACOLUMBAR TREATMENT     Patient Name: Patrick Wu MRN: [id number] DOB:16-Jun-1933, 89 y.o., male Today's Date: 01/08/2024   PT End of Session - 02/08/24 1316     Visit Number 5    Number of Visits 17    Date for Recertification  03/04/24    Authorization Type eval: 01/08/24    PT Start Time 1315    PT Stop Time 1356    PT Time Calculation (min) 41 min    Activity Tolerance Patient tolerated treatment well    Behavior During Therapy [facility] for tasks assessed/performed          Past Medical History:  Diagnosis Date   Asthma    Basal cell carcinoma 03/26/2019   Nodular. Glabella. Mohs 07/02/2019   Basal cell carcinoma 02/18/2021   R upper back, EDC   Basal cell carcinoma 08/26/2021   Right preauricular. Nodular and infiltrative. Mohs 12/29/2021    Past Surgical History:  Procedure Laterality Date   APPENDECTOMY       There are no active problems to display for this patient.   PCP: Cleotilde Oneil FALCON, MD   REFERRING PROVIDER: Rod Eleanor HERO, MD   REFERRING DIAG: [phone] (ICD-10-CM) - Spinal stenosis, lumbar region with neurogenic claudication R26.81 (ICD-10-CM) - Unsteadiness on feet   RATIONALE FOR EVALUATION AND TREATMENT: Rehabilitation   THERAPY DIAG: Other low back pain   ONSET DATE: 06/2022   FOLLOW-UP APPT SCHEDULED WITH REFERRING PROVIDER: Yes    FROM INITIAL EVALUATION SUBJECTIVE:                                                                                                                                                                                          SUBJECTIVE STATEMENT:  Mid back radiculitis with truncal weakness   PERTINENT HISTORY:  Pt had a T9 compression fracture 06/2022 with ongoing pain since that time.Pain occurs in lower to mid thoracic spine and radiates bilaterally around his ribs anteriorly. Pain does not radiate down to hips/BLE. He reports weakness in his trunk muscles as well as leg muscles  when the pain occurs. BLE weakness also results in some gait instability which worsens as the day progresses but no foot drop reported. He denies any BLE numbness/tingling or saddle paresthesia but does report what he feels like are proprioceptive deficits. He has tried spinal injections without relief. He underwent a nerve conduction study but did not receive a full explanation of the findings. He was referred to physical therapy for peripheral neuropathy last year. He completed physical therapy but no significant improvement. Pt has a history of chronic lumbar pain with  central canal stenosis but the low back pain has actually improved since his thoracic pain has increased. He takes Gabapentin at night which has helped him sleep. 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 Cleotilde suspects CLL as well. Per MD note, his physician is considering an epidural steroid injection distal to the site of maximal stenosis seen at L3-4 (injection at L4-5).    Per MD notes:  Spine Imaging: MRI of the cervical spine shows evidence of multilevel degenerative disc disease and widespread facet arthrosis. There is also evidence of moderate to severe foraminal narrowing at C3-4. MRI of the thoracic spine shows degenerative disc disease throughout as well as disc bulging at T5-6 and T6-7. There is also evidence of foraminal narrowing at T10/11. In the lumbar spine, evidence of L2-3 retrolisthesis, as well as right foraminal narrowing and spinal canal narrowing at L2-3, L3-4 and L4-5.   PAIN:    Pain Intensity: Present: 0/10, Best: 0/10, Worst: 8/10 Pain location: mid thoracic mostly, occasionally in lumbar spine Pain Quality: Dull, worse on the R side; Radiating: Yes, radiates around ribs bilaterally with electrical pain Numbness/Tingling: Yes occasional numbness in feet with prolonged sitting but typically no numbness. Denies saddle paresthesia. Loss of proprioception in feet Focal Weakness: Yes, occasional BLE  weakness; Aggravating factors: extended standing Relieving factors: sitting, laying down; 24-hour pain behavior: worsens as day progresses How long can you stand: History of prior back injury, pain, surgery, or therapy: Yes, history of chronic low back pain. T9 compression fracture 2024; Imaging: Yes, see history Red flags: Positive: T9 compression fracture, MGUS and possible CLL, Negative: bowel/bladder changes, saddle paresthesia, h/o abdominal aneurysm, abdominal pain, chills/fever, night sweats, unexplained weight gain/loss;   PRECAUTIONS: Back and Fall (History of T9 compression fracture)   WEIGHT BEARING RESTRICTIONS: No   FALLS: Has patient fallen in last 6 months? 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  2. Personal care (washing, dressing, etc.) 0 =  I can take care of myself normally without causing increased pain.  3. Lifting 2 = Pain prevents me from lifting heavy weights off the floor,but I can manage if the weights are conveniently positioned (e.g. on a table)  4. Walking 1 = Pain prevents me from walking more than 1 mile.  5. Sitting 0 =  I can sit in any chair as long as I like.  6. Standing 1 =  I can stand as long as I want but, it increases my pain.  7. Sleeping 1 = I can sleep well only by using pain medication.  8. Social Life 0 = My social life is normal and does not increase my pain.  9.  Traveling 0 =  I can travel anywhere without increased pain.  10. Employment/ Homemaking 1 = My normal homemaking/job activities increase my pain, but I can still perform all that is required of me  Total 8/50 (16%)  Cognition Patient is oriented to person, place, and time.  Recent memory is intact.  Remote memory is intact.  Attention span and concentration are intact.  Expressive speech is intact.  Patient's fund of knowledge is within normal limits for educational level.                      Gross Musculoskeletal Assessment Tremor: None Bulk: Normal Tone: Normal No visible step-off along spinal column, no signs of scoliosis   GAIT: Pt ambulates without assistive device. R shoulder lower than L and decreased trunk rotation noted. Mild foot pronation bilaterally but not excessive. Gait speed WNL   Posture: Lumbar lordosis: Decreased natural lordosis Iliac crest height: Equal bilaterally Lumbar lateral shift: Negative   AROM     AROM (Normal range in degrees) AROM   Lumbar    Flexion (65) Severely decreased segmental flexion  Extension (30) Severely decreased segmental   Right lateral flexion (25) Moderate to severe loss  Left lateral flexion (25) Moderate to severe loss  Right rotation (30) Mod loss  Left rotation (30) Mod loss         Hip Right Left  Flexion (125) WNL WNL  Extension (15)      Abduction (40)      Adduction       Internal Rotation (45) Decreased Decreased  External Rotation (45) WNL WNL         Knee      Flexion (135) WNL WNL  Extension (0) WNL WNL  (* = pain; Blank rows = not tested)   LE MMT: MMT (out of 5) Right   Left    Hip flexion 5 5  Hip extension      Hip abduction (sitting) 5 5  Hip adduction      Hip internal rotation 5 5  Hip external rotation 5 4+  Knee flexion 5 5  Knee extension 5 5  Ankle dorsiflexion 5 5  Ankle plantarflexion Active Active  (* = pain; Blank rows = not tested)   Sensation Grossly intact to light  touch throughout bilateral LEs as determined by testing dermatomes L2-S2. Proprioception in great toe WNL BLE. Stereognosis and hot/cold testing deferred on this date.   Reflexes Deferred   Muscle Length Hamstrings: R: Negative L: Negative Ely (quadriceps): R: Not examined L: Not examined Thomas (hip flexors): R: Not examined L: Not examined Ober: R: Not examined L: Not examined   Palpation Deferred   Special Tests Lumbar Radiculopathy and Discogenic: Centralization and Peripheralization (SN 92, -LR 0.12): Not examined Slump (SN 83, -LR 0.32): R: Not examined L: Not examined SLR (SN 92, -LR 0.29): R: Negative L:  Negative Crossed SLR (SP 90): R: Negative L: Negative   Facet Joint: Extension-Rotation (SN 100, -LR 0.0): R: Not examined L: Not examined   Lumbar Foraminal Stenosis: Lumbar quadrant (SN 70): R: Negative L: Negative   Hip: FABER (SN 81): R: Negative L: Negative FADIR (SN 94): R: Negative L: Negative Hip scour (SN 50): R: Negative L: Negative  Functional Tasks Deferred   Beighton scale Deferred   Functional Outcome Measures   Results Comments  BERG 56/56 WNL  DGI      FGA      TUG 7.8 seconds WNL  5TSTS 11.5 seconds WNL  6 Minute Walk Test      10 Meter Gait Speed Self-selected: 9.6 s = 1.04 m/s; WNL  (Blank rows = not tested)     TODAY'S TREATMENT  02/08/24:  SUBJECTIVE: Pt reports that he is doing well today. No changes since the last therapy session. No specific questions or concerns currently.   PAIN: Denies  Ther-ex  NuStep BLE only at start of session for BLE strengthening and ROM during interval history x 8 minutes; Seated march while sitting on blue physioball 2 x 10 each LE  Seated LAQ while sitting on blue physioball 2 x 10 each LE  Alternate arm/leg lift while sitting on blue physioball 2 x 10 each side  Hooklying marches with blue tband around knees  x10  Hooklying bridge with marches blue tband around knees 2 x 15; 90/90 bicycles 2 x  15 BLE; Supine 90/90 position with overhead 4kg med ball reach x 20 - painful on lumbar spine Hooklying overhead 6kg med ball reach 2 x 20  Seated russian twists with 6kg med ball 3 x 10    Not performed: Hooklying bent knee fall outs x 10 BLE, cues to maintain stable pelvis; Sidelying clams with manual resistance from therapist x 10 BLE; Sidelying hip abduction with manual resistance from therapist x 10 BLE; Standing Nautilus pallof press 30# 2 x 10, 40# 2 x 10; Nautilus standing lat pull down 95# x 20, 110# x 15; Nautilus standing shoulder extension from shoulder height 50# x 12, x 20;   PATIENT EDUCATION:  Education details: Exercise form/technique, updated HEP Person educated: Patient Education method: Explanation and handout Education comprehension: verbalized understanding and returned demonstration     HOME EXERCISE PROGRAM:  Access Code: [email or web address] URL: [email or web address] Date: 02/01/2024 Prepared by: Selinda Eck  Exercises - Bird Dog  - 1 x daily - 3-4 x weekly - 3 sets - 10 reps - 2s hold - Supine Bicycles  - 1 x daily - 3-4 x weekly - 3 sets - 10 reps - 2s hold - Clamshell with Resistance  - 1 x daily - 3-4 x weekly - 3 sets - 10 reps - 2s hold - Clamshell with Resistance (Mirrored)  - 1 x daily - 3-4 x weekly - 3 sets - 10 reps - 2s hold - Sidelying Hip Abduction with Resistance at Thighs  - 1 x daily - 3-4 x weekly - 3 sets - 10 reps - 2s hold - Sidelying Hip Abduction with Resistance at Thighs (Mirrored)  - 1 x daily - 3-4 x weekly - 3 sets - 10 reps - 2s hold - Plank on Knees  - 1 x daily - 3-4 x weekly - 3 reps - 30-45s hold - Swiss Ball March and Kick  - 1 x daily - 3-4 x weekly - 3 sets - 10 reps - Seated March with Opposite Arm Flexion on Swiss Ball  - 1 x daily - 3-4 x weekly - 3 sets - 10 reps     ASSESSMENT:   CLINICAL IMPRESSION:   Progressed strengthening exercises during session today with patient. Session focused on core  strengthening in different positions and different surfaces with good tolerance. No HEP updates this date. Plan to progress stabilization strengthening at future sessions. Pt encouraged to follow-up as scheduled. He will benefit from PT services to address deficits in strength, balance, and mobility in order to return to improve function at home and decrease his risk for falls.     OBJECTIVE IMPAIRMENTS: decreased strength, postural dysfunction, and pain.    ACTIVITY LIMITATIONS: lifting, standing, and sleeping   PARTICIPATION LIMITATIONS: community activity   PERSONAL FACTORS: Age, Past/current experiences, Time since onset of injury/illness/exacerbation,  and 1-2 comorbidities: thoracic radiculitis, DDD, and MGUS are also affecting patient's functional outcome.    REHAB POTENTIAL: Good   CLINICAL DECISION MAKING: Stable/uncomplicated   EVALUATION COMPLEXITY: Low     GOALS: Goals reviewed with patient? No   SHORT TERM GOALS: Target date: 02/05/2024   Pt will be independent with HEP in order to improve strength and decrease back pain to improve pain-free function at home and with leisure activities Baseline:  Goal status: INITIAL     LONG TERM GOALS: Target date: 03/04/2024   Pt will report 50% reduction in episodes of trunk instability/weakness in order to improve his safety/confidence with ambulation;  Baseline:  Goal status: INITIAL   2.  Pt will demonstrate safe lifting mechanics in order to reduce stress on lumbar and thoracic spine and improve his ability to exercise at home. Baseline:  Goal status: INITIAL   3.  Pt will improve ABC by at least 13% in order to demonstrate clinically significant improvement in balance confidence.   Baseline: 01/18/24: 92.5% Goal status: DISCONTINUED  4.  Pt will increase strength of bilateral hip abduction by at least 1/2 MMT grade in order to demonstrate improvement in strength and function  Baseline: 01/18/24: 4/5 bilaterally Goal status:  INITIAL   PLAN: PT FREQUENCY: 1-2x/week   PT DURATION: 8 weeks   PLANNED INTERVENTIONS: Therapeutic exercises, Therapeutic activity, Neuromuscular re-education, Balance training, Gait training, Patient/Family education, Self Care, Vestibular training, Canalith repositioning, Orthotic/Fit training, DME instructions, Dry Needling, Electrical stimulation, Cryotherapy, Moist heat, Taping, Manual therapy, and Re-evaluation.   PLAN FOR NEXT SESSION: lumbar/hip palpation, reflex testing, consider DGI/FGA/Mini BESTest, progress stabilization strengthening, review/modify HEP for neutral spinal stabilization exercises as necessary;     Maryanne Finder, PT, DPT Physical Therapist - [facility]  02/08/2024 1:16 PM  "

## 2024-02-15 ENCOUNTER — Ambulatory Visit

## 2024-02-19 ENCOUNTER — Ambulatory Visit

## 2024-02-20 ENCOUNTER — Ambulatory Visit

## 2024-02-21 ENCOUNTER — Ambulatory Visit: Attending: Orthopedic Surgery

## 2024-02-22 ENCOUNTER — Ambulatory Visit

## 2024-02-29 ENCOUNTER — Ambulatory Visit

## 2024-03-07 ENCOUNTER — Ambulatory Visit

## 2024-03-20 ENCOUNTER — Ambulatory Visit: Admitting: Dermatology

## 2024-03-21 ENCOUNTER — Ambulatory Visit: Admitting: Dermatology
